# Patient Record
Sex: Female | Born: 1945 | Race: White | Hispanic: No | Marital: Married | Smoking: Never smoker
Health system: Southern US, Community
[De-identification: ages and names within clinical notes are randomized; demographics above are authoritative.]

## PROBLEM LIST (undated history)

## (undated) DIAGNOSIS — R600 Localized edema: Secondary | ICD-10-CM

## (undated) DIAGNOSIS — E079 Disorder of thyroid, unspecified: Secondary | ICD-10-CM

## (undated) DIAGNOSIS — I10 Essential (primary) hypertension: Secondary | ICD-10-CM

## (undated) DIAGNOSIS — G4733 Obstructive sleep apnea (adult) (pediatric): Secondary | ICD-10-CM

## (undated) DIAGNOSIS — Z9989 Dependence on other enabling machines and devices: Secondary | ICD-10-CM

## (undated) HISTORY — DX: Dependence on other enabling machines and devices: Z99.89

## (undated) HISTORY — PX: TUBAL LIGATION: SHX77

## (undated) HISTORY — DX: Essential (primary) hypertension: I10

## (undated) HISTORY — DX: Disorder of thyroid, unspecified: E07.9

## (undated) HISTORY — DX: Localized edema: R60.0

## (undated) HISTORY — PX: OTHER SURGICAL HISTORY: SHX169

## (undated) HISTORY — DX: Obstructive sleep apnea (adult) (pediatric): G47.33

---

## 2014-09-06 NOTE — Progress Notes (Addendum)
Cardiology Office Note   Date:  09/07/2014   ID:  Karen, Schultz 03-12-1945, MRN [id number]  PCP:  Neale Burly, MD  Cardiologist:   Sharol Harness, MD   Chief Complaint  Patient presents with  . New Evaluation    PCP referral - aching pain on right side of chest when going up hill when walking daily mile or walking stairs; complains of shortness of breath with exertion (not all the time); reports she was told years ago she had a skipped beats but she doesn't always feel this; occasional lightheadedness/dizziness; complains of bilateral LE edema for years      History of Present Illness: Karen Schultz is a 69 y.o. female OSA and HTN who presents for an evaluation of LE edema.  She first noticed intermittent swelling approximately 3 years ago.  However, since June it has increased.  She especially noted that her legs were swollen while on a cruise ship in June.  She especially notices it after she has been on her feet for extended periods of time or when it is hot outside.  She denies orthopnea, PND or recent weight change.  She is able to walk up four flights of stairs before feeling short of breath.  She has been trying to limit her salt intake.  [name] sometimes notes an aching sensation on the R side of her chest when walking up an incline.   It is associated with SOB and the sensation does not radiate.  This is not associated with nausea or diaphoresis.  It improves with rest.  She typically notices it when it is very hot and humid outside.  She mentioned this to one of her doctors in March 2016 and was started on a qvar inhaler without improvement.  She walks a mile daily and does yoga three times per week.  No orthopnea or PND  Of note, [name] has undergone 2 stress tests, most recently 8-9 years ago and each was unremarkable.      Past Medical History  Diagnosis Date  . OSA on CPAP     5-6 years (as of 09/2014)    No past surgical history on  file.   Current Outpatient Prescriptions  Medication Sig Dispense Refill  . beclomethasone (QVAR) 80 MCG/ACT inhaler Inhale into the lungs as needed.    . benazepril-hydrochlorthiazide (LOTENSIN HCT) 20-25 MG per tablet Take 1 tablet by mouth daily.    . Calcium Carbonate-Vit D-Min (CALTRATE 600+D PLUS MINERALS) 600-800 MG-UNIT CHEW Chew 2 Doses by mouth daily.    . fluticasone (FLONASE) 50 MCG/ACT nasal spray Place into both nostrils as needed for allergies or rhinitis.    Marland Kitchen levothyroxine (SYNTHROID, LEVOTHROID) 100 MCG tablet Take 100 mcg by mouth daily before breakfast.    . NON FORMULARY at bedtime. CPAP    . RA KRILL OIL PO Take 1 capsule by mouth daily.    . vitamin B-12 (CYANOCOBALAMIN) 1000 MCG tablet Take 1,000 mcg by mouth daily.     No current facility-administered medications for this visit.    Allergies:   Codeine and Sulfur    Social History:  The patient  reports that she has never smoked. She has never used smokeless tobacco. She reports that she drinks alcohol. She reports that she does not use illicit drugs.   Family History:  The patient's family history is not on file.    ROS:  Please see the history of present illness.   Otherwise,  review of systems are positive for none.   All other systems are reviewed and negative.    PHYSICAL EXAM: VS:  BP 142/86 mmHg  Pulse 84  Ht 5\' 2"  (1.575 m)  Wt 87.363 kg (192 lb 9.6 oz)  BMI 35.22 kg/m2 , BMI Body mass index is 35.22 kg/(m^2). GENERAL:  Well appearing HEENT:  Pupils equal round and reactive, fundi not visualized, oral mucosa unremarkable NECK:  No jugular venous distention, waveform within normal limits, carotid upstroke brisk and symmetric, no bruits, no thyromegaly LYMPHATICS:  No cervical, adenopathy LUNGS:  Clear to auscultation bilaterally CHEST:  Unremarkable HEART:  PMI not displaced or sustained,S1 and S2 within normal limits, no S3, no S4, no clicks, no rubs, no murmurs ABD:  Flat, positive bowel  sounds normal in frequency in pitch, no bruits, no rebound, no guarding, no midline pulsatile mass, no hepatomegaly, no splenomegaly EXT:  2 plus pulses throughout, +non-pitting edema to above the ankles bilaterally, no cyanosis no clubbing SKIN:  No rashes no nodules NEURO:  Cranial nerves II through XII grossly intact, motor grossly intact throughout PSYCH:  Cognitively intact, oriented to person place and time    EKG:  EKG is not ordered today. 08/10/14: Sinus rhythm.  Borderline R axis deviation.  Recent Labs: No results found for requested labs within last 365 days.    Lipid Panel No results found for: CHOL, TRIG, HDL, CHOLHDL, VLDL, LDLCALC, LDLDIRECT    Wt Readings from Last 3 Encounters:  09/07/14 87.363 kg (192 lb 9.6 oz)      Other studies Reviewed: Additional studies/ records that were reviewed today include: EKG Review of the above records demonstrates:  Please see elsewhere in the note.   Lipid panel 08/10/14: chol 153, tri 158, hdl 46, ldl 75  Creatinine 0.85 Carotid arteries: L PSV <110cm/s, R PSV <110cm/s R ABI 1.04, L ABI 1.03 ASSESSMENT AND PLAN:  # LE edema: Exam and history are not consistent with heart failure.  JVD is not distended and for the most part, her edema is not pitting.  This is more likely related to venous insufficiency. - Check BNP  # Chest pain: Patient has R sided, atypical chest pain with ambulating up an incline.  She is otherwise able to exercise without limitation.  Given her age and HTN, will arrange for stress testing. - exercise treadmill test   Current medicines are reviewed at length with the patient today.  The patient does not have concerns regarding medicines.  The following changes have been made:  no change  Labs/ tests ordered today include: ETT  Orders Placed This Encounter  Procedures  . B Nat Peptide  . Exercise Tolerance Test     Disposition:   FU with Oval Linsey in 1 year   Signed, Sharol Harness, MD   09/07/2014 12:35 PM    [facility]

## 2014-09-07 ENCOUNTER — Encounter: Payer: Self-pay | Admitting: Cardiovascular Disease

## 2014-09-07 ENCOUNTER — Ambulatory Visit (INDEPENDENT_AMBULATORY_CARE_PROVIDER_SITE_OTHER): Payer: Medicare Other | Admitting: Cardiovascular Disease

## 2014-09-07 VITALS — BP 142/86 | HR 84 | Ht 62.0 in | Wt 192.6 lb

## 2014-09-07 DIAGNOSIS — R079 Chest pain, unspecified: Secondary | ICD-10-CM | POA: Diagnosis not present

## 2014-09-07 DIAGNOSIS — Z9989 Dependence on other enabling machines and devices: Secondary | ICD-10-CM

## 2014-09-07 DIAGNOSIS — I10 Essential (primary) hypertension: Secondary | ICD-10-CM | POA: Insufficient documentation

## 2014-09-07 DIAGNOSIS — R600 Localized edema: Secondary | ICD-10-CM

## 2014-09-07 DIAGNOSIS — G4733 Obstructive sleep apnea (adult) (pediatric): Secondary | ICD-10-CM | POA: Diagnosis not present

## 2014-09-07 DIAGNOSIS — E669 Obesity, unspecified: Secondary | ICD-10-CM

## 2014-09-07 NOTE — Patient Instructions (Signed)
Your physician recommends that you return for lab work TODAY >> there is a lab on the first floor of this building in [address] (they are closed for lunch from 1230-130) >> there is a lab on [address] - [address]  Exercise Stress Electrocardiogram An exercise stress electrocardiogram is a test to check how blood flows to your heart. It is done to find areas of poor blood flow. You will need to walk on a treadmill for this test. The electrocardiogram will record your heartbeat when you are at rest and when you are exercising. BEFORE THE PROCEDURE  Do not have drinks with caffeine or foods with caffeine for 24 hours before the test, or as told by your doctor. This includes coffee, tea (even decaf tea), sodas, chocolate, and cocoa.  Follow your doctor's instructions about eating and drinking before the test.  Ask your doctor what medicines you should or should not take before the test. Take your medicines with water unless told by your doctor not to.  If you use an inhaler, bring it with you to the test.  Bring a snack to eat after the test.  Do not  smoke for 4 hours before the test.  Do not put lotions, powders, creams, or oils on your chest before the test.  Wear comfortable shoes and clothing. PROCEDURE  You will have patches put on your chest. Small areas of your chest may need to be shaved. Wires will be connected to the patches.  Your heart rate will be watched while you are resting and while you are exercising.  You will walk on the treadmill. The treadmill will slowly get faster to raise your heart rate.  The test will take about 1-2 hours. AFTER THE PROCEDURE  Your heart rate and blood pressure will be watched after the test.  You may return to your normal diet, activities, and medicines or as told by your doctor. Document Released: 07/11/2007 Document Revised: 06/08/2013 Document Reviewed: 09/29/2012 [facility] Patient Information 2015 [place], [place]. This  information is not intended to replace advice given to you by your health care provider. Make sure you discuss any questions you have with your health care provider.   Your physician wants you to follow-up in: 1 year with Dr. Oval Linsey. You will receive a reminder letter in the mail two months in advance. If you don't receive a letter, please call our office to schedule the follow-up appointment.

## 2014-09-08 LAB — BRAIN NATRIURETIC PEPTIDE: Brain Natriuretic Peptide: 35.6 pg/mL (ref 0.0–100.0)

## 2014-09-13 ENCOUNTER — Telehealth: Payer: Self-pay | Admitting: *Deleted

## 2014-09-13 NOTE — Telephone Encounter (Signed)
LEFT MESSAGE TO CALL BACK ABOUT LAB RESULTS

## 2014-09-13 NOTE — Telephone Encounter (Signed)
-----   Message from Skeet Latch, MD sent at 09/08/2014 12:45 PM EDT ----- Please let Ms. Hassell Done know that her LE edema and SOB are not from heart failure.  Her BNP is normal.  It is likely from venous insufficiency.  She should try elevating her legs and wearing support hose.   You can get compression stockings in [place] at [employer] # [phone].

## 2014-09-14 ENCOUNTER — Encounter: Payer: Self-pay | Admitting: Cardiovascular Disease

## 2014-09-14 NOTE — Telephone Encounter (Signed)
Returning your call from yesterday. °

## 2014-09-14 NOTE — Telephone Encounter (Signed)
Spoke to patinet

## 2014-09-14 NOTE — Telephone Encounter (Signed)
Spoke to patient's husband. Result given.  verbalized understanding.  husband states patient has support stocking.

## 2014-09-22 ENCOUNTER — Telehealth (HOSPITAL_COMMUNITY): Payer: Self-pay

## 2014-09-22 NOTE — Telephone Encounter (Signed)
Encounter complete. 

## 2014-09-24 ENCOUNTER — Ambulatory Visit (HOSPITAL_COMMUNITY)
Admission: RE | Admit: 2014-09-24 | Discharge: 2014-09-24 | Disposition: A | Discharge status: Home / Self Care | Payer: Medicare Other | Source: Ambulatory Visit | Attending: Cardiology | Admitting: Cardiology

## 2014-09-24 DIAGNOSIS — R079 Chest pain, unspecified: Secondary | ICD-10-CM | POA: Insufficient documentation

## 2014-09-24 LAB — EXERCISE TOLERANCE TEST
CHL CUP MPHR: 151 {beats}/min
CHL CUP RESTING HR STRESS: 79 {beats}/min
CHL RATE OF PERCEIVED EXERTION: 16
CSEPED: 6 min
Estimated workload: 7 METS
Peak HR: 151 {beats}/min
Percent HR: 100 %

## 2015-03-21 DIAGNOSIS — Z1231 Encounter for screening mammogram for malignant neoplasm of breast: Secondary | ICD-10-CM | POA: Diagnosis not present

## 2015-03-21 DIAGNOSIS — Z6841 Body Mass Index (BMI) 40.0 and over, adult: Secondary | ICD-10-CM | POA: Diagnosis not present

## 2015-03-21 DIAGNOSIS — Z01419 Encounter for gynecological examination (general) (routine) without abnormal findings: Secondary | ICD-10-CM | POA: Diagnosis not present

## 2015-04-10 DIAGNOSIS — L239 Allergic contact dermatitis, unspecified cause: Secondary | ICD-10-CM | POA: Diagnosis not present

## 2015-04-10 DIAGNOSIS — Z79899 Other long term (current) drug therapy: Secondary | ICD-10-CM | POA: Diagnosis not present

## 2015-04-10 DIAGNOSIS — E039 Hypothyroidism, unspecified: Secondary | ICD-10-CM | POA: Diagnosis not present

## 2015-04-14 DIAGNOSIS — E784 Other hyperlipidemia: Secondary | ICD-10-CM | POA: Diagnosis not present

## 2015-04-14 DIAGNOSIS — L2389 Allergic contact dermatitis due to other agents: Secondary | ICD-10-CM | POA: Diagnosis not present

## 2015-04-14 DIAGNOSIS — Z6834 Body mass index (BMI) 34.0-34.9, adult: Secondary | ICD-10-CM | POA: Diagnosis not present

## 2015-04-14 DIAGNOSIS — E02 Subclinical iodine-deficiency hypothyroidism: Secondary | ICD-10-CM | POA: Diagnosis not present

## 2015-04-14 DIAGNOSIS — G932 Benign intracranial hypertension: Secondary | ICD-10-CM | POA: Diagnosis not present

## 2015-06-10 DIAGNOSIS — Z6834 Body mass index (BMI) 34.0-34.9, adult: Secondary | ICD-10-CM | POA: Diagnosis not present

## 2015-06-10 DIAGNOSIS — R1084 Generalized abdominal pain: Secondary | ICD-10-CM | POA: Diagnosis not present

## 2015-06-16 DIAGNOSIS — R1084 Generalized abdominal pain: Secondary | ICD-10-CM | POA: Diagnosis not present

## 2015-06-16 DIAGNOSIS — R109 Unspecified abdominal pain: Secondary | ICD-10-CM | POA: Diagnosis not present

## 2015-06-21 DIAGNOSIS — M549 Dorsalgia, unspecified: Secondary | ICD-10-CM | POA: Diagnosis not present

## 2015-06-21 DIAGNOSIS — M47816 Spondylosis without myelopathy or radiculopathy, lumbar region: Secondary | ICD-10-CM | POA: Diagnosis not present

## 2015-06-21 DIAGNOSIS — M47814 Spondylosis without myelopathy or radiculopathy, thoracic region: Secondary | ICD-10-CM | POA: Diagnosis not present

## 2015-07-01 DIAGNOSIS — Z6834 Body mass index (BMI) 34.0-34.9, adult: Secondary | ICD-10-CM | POA: Diagnosis not present

## 2015-07-01 DIAGNOSIS — M545 Low back pain: Secondary | ICD-10-CM | POA: Diagnosis not present

## 2015-07-21 DIAGNOSIS — Z79899 Other long term (current) drug therapy: Secondary | ICD-10-CM | POA: Diagnosis not present

## 2015-07-21 DIAGNOSIS — M545 Low back pain: Secondary | ICD-10-CM | POA: Diagnosis not present

## 2015-07-21 DIAGNOSIS — I10 Essential (primary) hypertension: Secondary | ICD-10-CM | POA: Diagnosis not present

## 2015-07-21 DIAGNOSIS — Z6834 Body mass index (BMI) 34.0-34.9, adult: Secondary | ICD-10-CM | POA: Diagnosis not present

## 2015-07-21 DIAGNOSIS — E038 Other specified hypothyroidism: Secondary | ICD-10-CM | POA: Diagnosis not present

## 2015-07-21 DIAGNOSIS — R5383 Other fatigue: Secondary | ICD-10-CM | POA: Diagnosis not present

## 2015-09-25 NOTE — Progress Notes (Signed)
Cardiology Office Note   Date:  09/26/2015   ID:  Karen Schultz, Karen Schultz 21-Jan-1946, MRN [id number]  PCP:  Neale Burly, MD  Cardiologist:   Skeet Latch, MD   Chief Complaint  Patient presents with  . Annual Exam    sob; only when not consistently exercising. edema;in ankles and and feet. numbness in legs.      History of Present Illness: Karen Schultz is a 70 y.o. female OSA and HTN who presents for follow up.  [name] was first seen 09/2014 with lower extremity edema and atypical chest pain.  Her edema has been occurring intermittently for 3 years and was felt to be due to venous insufficiency.  BNP was 35.  She also reported aching in the right side of her chest when walking up an incline and was referred for ETT 09/24/14 that was negative for ischemia.  She achieved 7 METS on the Bruce protocol.  She notes that she has been gaining weight.  She goes to yoga once per week and walks a mile daily.    She also goes to the [facility] once or twice per week.  She denies chest pain with exertion but does note shortness of breath when walking up an incline.  She has not been following any particular diet.  She is concerned because she gained 8 lb in the last year.  She wonders if this could be due to her thyroid.    [name] reports burning pain in her left calf with walking.  She also notes lower extremity edema that is worse after walking or going on long car rides.  She denies orthopnea or PND.   Past Medical History:  Diagnosis Date  . Lower extremity edema 09/26/2015  . OSA on CPAP    5-6 years (as of 09/2014)    No past surgical history on file.   Current Outpatient Prescriptions  Medication Sig Dispense Refill  . benazepril-hydrochlorthiazide (LOTENSIN HCT) 20-25 MG per tablet Take 1 tablet by mouth daily.    . Calcium Carbonate-Vit D-Min (CALTRATE 600+D PLUS MINERALS) 600-800 MG-UNIT CHEW Chew 2 Doses by mouth daily.    . fluticasone (FLONASE) 50 MCG/ACT nasal spray  Place into both nostrils as needed for allergies or rhinitis.    Marland Kitchen levothyroxine (SYNTHROID, LEVOTHROID) 100 MCG tablet Take 100 mcg by mouth daily before breakfast.    . NON FORMULARY at bedtime. CPAP    . RA KRILL OIL PO Take 1 capsule by mouth daily.    . vitamin B-12 (CYANOCOBALAMIN) 1000 MCG tablet Take 1,000 mcg by mouth daily.     No current facility-administered medications for this visit.     Allergies:   Codeine and Sulfur    Social History:  The patient  reports that she has never smoked. She has never used smokeless tobacco. She reports that she drinks alcohol. She reports that she does not use drugs.   Family History:  The patient's family history is not on file.    ROS:  Please see the history of present illness.   Otherwise, review of systems are positive for none.   All other systems are reviewed and negative.    PHYSICAL EXAM: VS:  BP 125/77   Pulse 74   Ht 5' (1.524 m)   Wt 203 lb 6.4 oz (92.3 kg)   BMI 39.72 kg/m  , BMI Body mass index is 39.72 kg/m. GENERAL:  Well appearing HEENT:  Pupils equal round and reactive, fundi  not visualized, oral mucosa unremarkable NECK:  No jugular venous distention, waveform within normal limits, carotid upstroke brisk and symmetric, no bruits, no thyromegaly LYMPHATICS:  No cervical, adenopathy LUNGS:  Clear to auscultation bilaterally CHEST:  Unremarkable HEART:  PMI not displaced or sustained,S1 and S2 within normal limits, no S3, no S4, no clicks, no rubs, no murmurs ABD:  Flat, positive bowel sounds normal in frequency in pitch, no bruits, no rebound, no guarding, no midline pulsatile mass, no hepatomegaly, no splenomegaly EXT:  2 plus pulses throughout, +non-pitting edema to above the ankles bilaterally, no cyanosis no clubbing SKIN:  No rashes no nodules NEURO:  Cranial nerves II through XII grossly intact, motor grossly intact throughout PSYCH:  Cognitively intact, oriented to person place and time    EKG:  EKG is  ordered today. 09/26/15: Sinus rhythm rate 73 bpm.  Borderline R axis deviation 08/10/14: Sinus rhythm.  Borderline R axis deviation.  Recent Labs: No results found for requested labs within last 8760 hours.    Lipid Panel No results found for: CHOL, TRIG, HDL, CHOLHDL, VLDL, LDLCALC, LDLDIRECT    Wt Readings from Last 3 Encounters:  09/26/15 203 lb 6.4 oz (92.3 kg)  09/07/14 192 lb 9.6 oz (87.4 kg)      Other studies Reviewed: Additional studies/ records that were reviewed today include: EKG Review of the above records demonstrates:  Please see elsewhere in the note.   Lipid panel 08/10/14: chol 153, tri 158, hdl 46, ldl 75  Creatinine 0.85 Carotid arteries: L PSV <110cm/s, R PSV <110cm/s R ABI 1.04, L ABI 1.03  ASSESSMENT AND PLAN:  # LE edema:  # Exertional dyspnea:  Exam and history are not consistent with heart failure.  JVD is not distended and for the most part, her edema is not pitting.  This is more likely related to venous insufficiency.  BNP was normal at her last visit.  I have recommended that she wear compression stockings.  We will also check an echocardiogram given that she also reports exertional dyspnea. - Check BNP  # Hypothyroidism: [name] prefers to see an endocrinologist for her hypothyroidism.  We will refer her today.  # Claudication: [name] reports claudication when walking.  She has normal DP/PT pulses bilaterally.  We will check ABIs and arterial Dopplers.  Current medicines are reviewed at length with the patient today.  The patient does not have concerns regarding medicines.  The following changes have been made:  no change  Labs/ tests ordered today include:   Orders Placed This Encounter  Procedures  . EKG 12-Lead  . ECHOCARDIOGRAM COMPLETE     Disposition:   FU with Oval Linsey in 1 year   Signed, Skeet Latch, MD  09/26/2015 11:01 AM    [place]

## 2015-09-26 ENCOUNTER — Ambulatory Visit (INDEPENDENT_AMBULATORY_CARE_PROVIDER_SITE_OTHER): Payer: Commercial Managed Care - HMO | Admitting: Cardiovascular Disease

## 2015-09-26 ENCOUNTER — Encounter (INDEPENDENT_AMBULATORY_CARE_PROVIDER_SITE_OTHER): Payer: Self-pay

## 2015-09-26 ENCOUNTER — Encounter: Payer: Self-pay | Admitting: Cardiovascular Disease

## 2015-09-26 VITALS — BP 125/77 | HR 74 | Ht 60.0 in | Wt 203.4 lb

## 2015-09-26 DIAGNOSIS — R0602 Shortness of breath: Secondary | ICD-10-CM

## 2015-09-26 DIAGNOSIS — E039 Hypothyroidism, unspecified: Secondary | ICD-10-CM | POA: Diagnosis not present

## 2015-09-26 DIAGNOSIS — R600 Localized edema: Secondary | ICD-10-CM | POA: Diagnosis not present

## 2015-09-26 DIAGNOSIS — I10 Essential (primary) hypertension: Secondary | ICD-10-CM

## 2015-09-26 HISTORY — DX: Localized edema: R60.0

## 2015-09-26 NOTE — Patient Instructions (Addendum)
Medication Instructions:  Your physician recommends that you continue on your current medications as directed. Please refer to the Current Medication list given to you today.  Labwork: none  Testing/Procedures: Your physician has requested that you have an echocardiogram. Echocardiography is a painless test that uses sound waves to create images of your heart. It provides your doctor with information about the size and shape of your heart and how well your heart's chambers and valves are working. This procedure takes approximately one hour. There are no restrictions for this procedure. AT [place]  Your physician has requested that you have a lower or upper extremity arterial duplex. This test is an ultrasound of the arteries in the legs or arms. It looks at arterial blood flow in the legs and arms. Allow one hour for Lower and Upper Arterial scans. There are no restrictions or special instructions WITH ABI's  Follow-Up: Your physician wants you to follow-up in: 1 year  You will receive a reminder letter in the mail two months in advance. If you don't receive a letter, please call our office to schedule the follow-up appointment.  Will arrange for you to see an Endocrinologist and call with date and time  If you need a refill on your cardiac medications before your next appointment, please call your pharmacy.

## 2015-09-26 NOTE — Addendum Note (Signed)
Addended by: Alvina Filbert B on: 09/26/2015 01:55 PM   Modules accepted: Orders

## 2015-09-27 ENCOUNTER — Other Ambulatory Visit: Payer: Self-pay | Admitting: Cardiovascular Disease

## 2015-09-27 DIAGNOSIS — R600 Localized edema: Secondary | ICD-10-CM

## 2015-10-05 ENCOUNTER — Other Ambulatory Visit (HOSPITAL_COMMUNITY): Payer: Commercial Managed Care - HMO

## 2015-10-06 ENCOUNTER — Ambulatory Visit (HOSPITAL_COMMUNITY)
Admission: RE | Admit: 2015-10-06 | Discharge: 2015-10-06 | Disposition: A | Discharge status: Home / Self Care | Payer: Commercial Managed Care - HMO | Source: Ambulatory Visit | Attending: Cardiology | Admitting: Cardiology

## 2015-10-06 DIAGNOSIS — I10 Essential (primary) hypertension: Secondary | ICD-10-CM | POA: Insufficient documentation

## 2015-10-06 DIAGNOSIS — I739 Peripheral vascular disease, unspecified: Secondary | ICD-10-CM | POA: Diagnosis not present

## 2015-10-06 DIAGNOSIS — R600 Localized edema: Secondary | ICD-10-CM | POA: Diagnosis not present

## 2015-10-07 ENCOUNTER — Ambulatory Visit (HOSPITAL_COMMUNITY): Payer: Commercial Managed Care - HMO | Attending: Internal Medicine

## 2015-10-07 ENCOUNTER — Other Ambulatory Visit: Payer: Self-pay

## 2015-10-07 DIAGNOSIS — R0602 Shortness of breath: Secondary | ICD-10-CM

## 2015-10-07 DIAGNOSIS — Z6839 Body mass index (BMI) 39.0-39.9, adult: Secondary | ICD-10-CM | POA: Insufficient documentation

## 2015-10-07 DIAGNOSIS — I119 Hypertensive heart disease without heart failure: Secondary | ICD-10-CM | POA: Insufficient documentation

## 2015-10-07 DIAGNOSIS — I071 Rheumatic tricuspid insufficiency: Secondary | ICD-10-CM | POA: Diagnosis not present

## 2015-10-07 DIAGNOSIS — I358 Other nonrheumatic aortic valve disorders: Secondary | ICD-10-CM | POA: Insufficient documentation

## 2015-10-07 DIAGNOSIS — R600 Localized edema: Secondary | ICD-10-CM | POA: Diagnosis not present

## 2015-10-07 DIAGNOSIS — G4733 Obstructive sleep apnea (adult) (pediatric): Secondary | ICD-10-CM | POA: Insufficient documentation

## 2015-10-07 DIAGNOSIS — R0600 Dyspnea, unspecified: Secondary | ICD-10-CM | POA: Diagnosis present

## 2015-10-07 DIAGNOSIS — I340 Nonrheumatic mitral (valve) insufficiency: Secondary | ICD-10-CM | POA: Insufficient documentation

## 2015-10-07 DIAGNOSIS — E669 Obesity, unspecified: Secondary | ICD-10-CM | POA: Diagnosis not present

## 2015-10-07 DIAGNOSIS — I351 Nonrheumatic aortic (valve) insufficiency: Secondary | ICD-10-CM | POA: Diagnosis not present

## 2015-10-11 ENCOUNTER — Telehealth: Payer: Self-pay | Admitting: Cardiovascular Disease

## 2015-10-11 NOTE — Telephone Encounter (Signed)
Note not needed 

## 2015-10-17 ENCOUNTER — Telehealth: Payer: Self-pay | Admitting: Cardiovascular Disease

## 2015-10-17 NOTE — Telephone Encounter (Signed)
Spoke w/ patient and discussed results of recent tests (echo and ABI) in detail. She voiced understanding and thanks. Requests copies of these be sent to Dr. Sherrie Sport.

## 2015-10-17 NOTE — Telephone Encounter (Signed)
New Message  Pt call requesting to speak with Rn about results of test on 8/31 and 9/1. Please call back to discuss

## 2015-11-10 DIAGNOSIS — Z6835 Body mass index (BMI) 35.0-35.9, adult: Secondary | ICD-10-CM | POA: Diagnosis not present

## 2015-11-10 DIAGNOSIS — M545 Low back pain: Secondary | ICD-10-CM | POA: Diagnosis not present

## 2015-11-10 DIAGNOSIS — H60392 Other infective otitis externa, left ear: Secondary | ICD-10-CM | POA: Diagnosis not present

## 2015-11-10 DIAGNOSIS — I10 Essential (primary) hypertension: Secondary | ICD-10-CM | POA: Diagnosis not present

## 2015-11-10 DIAGNOSIS — E038 Other specified hypothyroidism: Secondary | ICD-10-CM | POA: Diagnosis not present

## 2015-11-17 ENCOUNTER — Encounter: Payer: Self-pay | Admitting: Internal Medicine

## 2015-11-17 ENCOUNTER — Ambulatory Visit (INDEPENDENT_AMBULATORY_CARE_PROVIDER_SITE_OTHER): Payer: Commercial Managed Care - HMO | Admitting: Internal Medicine

## 2015-11-17 VITALS — BP 124/80 | HR 74 | Ht 60.5 in | Wt 199.0 lb

## 2015-11-17 DIAGNOSIS — E039 Hypothyroidism, unspecified: Secondary | ICD-10-CM | POA: Diagnosis not present

## 2015-11-17 LAB — TSH: TSH: 6.55 u[IU]/mL — ABNORMAL HIGH (ref 0.35–4.50)

## 2015-11-17 LAB — T4, FREE: FREE T4: 0.96 ng/dL (ref 0.60–1.60)

## 2015-11-17 LAB — T3, FREE: T3, Free: 2.9 pg/mL (ref 2.3–4.2)

## 2015-11-17 MED ORDER — LEVOTHYROXINE SODIUM 112 MCG PO TABS: 112.0000 ug | ORAL_TABLET | Freq: Every day | ORAL | 1 refills | Status: DC

## 2015-11-17 NOTE — Patient Instructions (Signed)
Please stop at the lab.  Please continue Levothyroxine 100 mcg daily.  Take the thyroid hormone every day, with water, at least 30 minutes before breakfast, separated by at least 4 hours from: - acid reflux medications - calcium - iron - multivitamins  Please come back for a follow-up appointment in 6 months. 

## 2015-11-17 NOTE — Progress Notes (Signed)
Patient ID: Karen Schultz, female   DOB: January 16, 1946, 70 y.o.   MRN: [id number]    HPI  Karen Schultz is a 70 y.o.-year-old female, referred by her cardiologist, Dr. Skeet Latch, for management of hypothyroidism.  Pt. has been dx with hypothyroidism in 1990s in [place] >> on Synthroid initially for a long time >> Levothyroxine 100 mcg (last decrease was from 150 to 100 mcg 6 mo).  She takes the thyroid hormone: - + fasting - with water - separated by ~30 min or less from b'fast  - no calcium after b'fast  (stopped months) - no iron, PPIs, multivitamins   No thyroid tests available for review.   Pt describes: - + weight gain - + fatigue - No heat or cold intolerance - No anxiety or depression - + Occasional constipation - no dry skin - + hair loss  Pt denies feeling nodules in neck, hoarseness, dysphagia/odynophagia, SOB with lying down.  She has no FH of thyroid disorders. No FH of thyroid cancer.  No h/o radiation tx to head or neck. No recent use of iodine supplements. Not on Biotin.  Pt. also has a history of B12 def, HTN, obstructive sleep apnea.  Her right hand is in a brace today << tendinitis from crocheting too much.  She exercises 3-4 times a week: Hand weights, ball, walking, yoga.  ROS: Constitutional: + see history of present illness Eyes: no blurry vision, no xerophthalmia ENT: no sore throat, + see history of present illness, + occasional tinnitus  Cardiovascular: no CP/ + SOB/no palpitations/+ leg swelling Respiratory: no cough/ + SOB Gastrointestinal: no N/V/D/ + occasional C/+ heartburn  Musculoskeletal: no muscle/joint aches Skin: no rashes Neurological: no tremors/numbness/tingling/dizziness Psychiatric: no depression/anxiety  Past Medical History:  Diagnosis Date  . Lower extremity edema 09/26/2015  . OSA on CPAP    5-6 years (as of 09/2014)   Past surgical history: - C-section in 1977 and 1979 - Tubal ligation  Social History   Social  History  . Marital status: Married    Spouse name: N/A  . Number of children: 1   Occupational History  . Homemaker    Social History Main Topics  . Smoking status: Never Smoker  . Smokeless tobacco: Never Used  . Alcohol use 0.0 oz/week     Comment: socially  . Drug use: No   Current Outpatient Prescriptions on File Prior to Visit  Medication Sig Dispense Refill  . benazepril-hydrochlorthiazide (LOTENSIN HCT) 20-25 MG per tablet Take 1 tablet by mouth daily.    . Calcium Carbonate-Vit D-Min (CALTRATE 600+D PLUS MINERALS) 600-800 MG-UNIT CHEW Chew 2 Doses by mouth daily.    . fluticasone (FLONASE) 50 MCG/ACT nasal spray Place into both nostrils as needed for allergies or rhinitis.    Marland Kitchen levothyroxine (SYNTHROID, LEVOTHROID) 100 MCG tablet Take 100 mcg by mouth daily before breakfast.    . NON FORMULARY at bedtime. CPAP    . RA KRILL OIL PO Take 1 capsule by mouth daily.    . vitamin B-12 (CYANOCOBALAMIN) 1000 MCG tablet Take 1,000 mcg by mouth daily.     No current facility-administered medications on file prior to visit.    Allergies  Allergen Reactions  . Codeine Nausea And Vomiting  . Sulfur     headache   No family history on file.  PE: BP 124/80 (BP Location: Left Arm, Patient Position: Sitting)   Pulse 74   Ht 5' 0.5" (1.537 m)   Wt 199 lb (90.3 kg)  SpO2 97%   BMI 38.22 kg/m  Wt Readings from Last 3 Encounters:  11/17/15 199 lb (90.3 kg)  09/26/15 203 lb 6.4 oz (92.3 kg)  09/07/14 192 lb 9.6 oz (87.4 kg)   Constitutional: overweight, in NAD Eyes: PERRLA, EOMI, no exophthalmos ENT: moist mucous membranes, no thyromegaly, no cervical lymphadenopathy Cardiovascular: RRR, No MRG Respiratory: CTA B Gastrointestinal: abdomen soft, NT, ND, BS+ Musculoskeletal: no deformities, strength intact in all 4: R hand in brace Skin: moist, warm, no rashes Neurological: no tremor with outstretched hands, DTR normal in all 4  ASSESSMENT: 1. Hypothyroidism  PLAN:  1.  Patient with long-standing hypothyroidism, on levothyroxine therapy. This is reportedly uncontrolled, however, I do not have TFTs for review. Her last levothyroxine dose change was several months ago >> we will recheck TFTs today. We will change the dose of levothyroxine depending on her labs.  - she appears euthyroid - she does not appear to have a goiter, thyroid nodules, or neck compression symptoms - We discussed about correct intake of levothyroxine, fasting, with water, separated by at least 30 minutes from breakfast, and separated by more than 4 hours from calcium, iron, multivitamins, acid reflux medications (PPIs).She is occasionally taking levothyroxine later, if she forgets to take it in the morning. I strongly advised her to start drinking her smoothie at least 30 minutes after she takes the levothyroxine and, if she forgets the tablets, to take it later in the day, but fasting.  - Will continue the current dose of levothyroxine for now, 100 g daily. - If labs today are abnormal, she will need to return in 6 weeks for repeat labs - Otherwise, I will see her back in 6 months  Component     Latest Ref Rng & Units 11/17/2015  T4,Free(Direct)     0.60 - 1.60 ng/dL 0.96  TSH     0.35 - 4.50 uIU/mL 6.55 (H)  Triiodothyronine,Free,Serum     2.3 - 4.2 pg/mL 2.9   TSH is high, therefore, will increase the levothyroxine dose to 112 g daily and repeat her tests in 6 weeks.  Philemon Kingdom, MD PhD [facility] Endocrinology

## 2015-12-26 ENCOUNTER — Other Ambulatory Visit (INDEPENDENT_AMBULATORY_CARE_PROVIDER_SITE_OTHER): Payer: Commercial Managed Care - HMO

## 2015-12-26 DIAGNOSIS — E039 Hypothyroidism, unspecified: Secondary | ICD-10-CM | POA: Diagnosis not present

## 2015-12-26 DIAGNOSIS — I10 Essential (primary) hypertension: Secondary | ICD-10-CM | POA: Diagnosis not present

## 2015-12-26 DIAGNOSIS — M545 Low back pain: Secondary | ICD-10-CM | POA: Diagnosis not present

## 2015-12-26 LAB — T4, FREE: Free T4: 1.41 ng/dL (ref 0.60–1.60)

## 2015-12-26 LAB — TSH: TSH: 2.26 u[IU]/mL (ref 0.35–4.50)

## 2016-01-02 ENCOUNTER — Telehealth: Payer: Self-pay | Admitting: Internal Medicine

## 2016-01-02 ENCOUNTER — Other Ambulatory Visit: Payer: Self-pay

## 2016-01-02 MED ORDER — LEVOTHYROXINE SODIUM 112 MCG PO TABS: 112.0000 ug | ORAL_TABLET | Freq: Every day | ORAL | 1 refills | Status: DC

## 2016-01-02 NOTE — Telephone Encounter (Signed)
Sent!

## 2016-01-02 NOTE — Telephone Encounter (Signed)
Patient need refill of levothyroxine Karen Schultz, LEVOTHROID) [place], [place] [phone] (Phone) [phone] (Fax)

## 2016-01-09 DIAGNOSIS — Z6835 Body mass index (BMI) 35.0-35.9, adult: Secondary | ICD-10-CM | POA: Diagnosis not present

## 2016-01-09 DIAGNOSIS — M25511 Pain in right shoulder: Secondary | ICD-10-CM | POA: Diagnosis not present

## 2016-01-09 DIAGNOSIS — M19011 Primary osteoarthritis, right shoulder: Secondary | ICD-10-CM | POA: Diagnosis not present

## 2016-01-11 ENCOUNTER — Other Ambulatory Visit: Payer: Self-pay | Admitting: *Deleted

## 2016-01-11 MED ORDER — LEVOTHYROXINE SODIUM 112 MCG PO TABS: 112.0000 ug | ORAL_TABLET | Freq: Every day | ORAL | 1 refills | Status: DC

## 2016-01-11 NOTE — Telephone Encounter (Signed)
Pt called and said that we need to resubmit the script for Levothyroxine to her [facility], they have not received it yet and she is running low.

## 2016-01-11 NOTE — Telephone Encounter (Signed)
Refill sent to [facility] 12/6

## 2016-01-17 DIAGNOSIS — M7541 Impingement syndrome of right shoulder: Secondary | ICD-10-CM | POA: Diagnosis not present

## 2016-01-17 DIAGNOSIS — M79644 Pain in right finger(s): Secondary | ICD-10-CM | POA: Diagnosis not present

## 2016-01-17 DIAGNOSIS — M25511 Pain in right shoulder: Secondary | ICD-10-CM | POA: Diagnosis not present

## 2016-01-17 DIAGNOSIS — M65311 Trigger thumb, right thumb: Secondary | ICD-10-CM | POA: Diagnosis not present

## 2016-01-20 DIAGNOSIS — M25511 Pain in right shoulder: Secondary | ICD-10-CM | POA: Diagnosis not present

## 2016-01-20 DIAGNOSIS — I10 Essential (primary) hypertension: Secondary | ICD-10-CM | POA: Diagnosis not present

## 2016-01-20 DIAGNOSIS — R5383 Other fatigue: Secondary | ICD-10-CM | POA: Diagnosis not present

## 2016-01-20 DIAGNOSIS — Z6835 Body mass index (BMI) 35.0-35.9, adult: Secondary | ICD-10-CM | POA: Diagnosis not present

## 2016-01-20 DIAGNOSIS — Z79899 Other long term (current) drug therapy: Secondary | ICD-10-CM | POA: Diagnosis not present

## 2016-01-23 DIAGNOSIS — L570 Actinic keratosis: Secondary | ICD-10-CM | POA: Diagnosis not present

## 2016-01-23 DIAGNOSIS — D1800 Hemangioma unspecified site: Secondary | ICD-10-CM | POA: Diagnosis not present

## 2016-01-23 DIAGNOSIS — D239 Other benign neoplasm of skin, unspecified: Secondary | ICD-10-CM | POA: Diagnosis not present

## 2016-01-24 DIAGNOSIS — G4733 Obstructive sleep apnea (adult) (pediatric): Secondary | ICD-10-CM | POA: Diagnosis not present

## 2016-01-27 DIAGNOSIS — I10 Essential (primary) hypertension: Secondary | ICD-10-CM | POA: Diagnosis not present

## 2016-01-27 DIAGNOSIS — M545 Low back pain: Secondary | ICD-10-CM | POA: Diagnosis not present

## 2016-02-23 DIAGNOSIS — M545 Low back pain: Secondary | ICD-10-CM | POA: Diagnosis not present

## 2016-02-23 DIAGNOSIS — I10 Essential (primary) hypertension: Secondary | ICD-10-CM | POA: Diagnosis not present

## 2016-02-27 ENCOUNTER — Other Ambulatory Visit: Payer: Self-pay | Admitting: Internal Medicine

## 2016-03-19 DIAGNOSIS — M545 Low back pain: Secondary | ICD-10-CM | POA: Diagnosis not present

## 2016-03-19 DIAGNOSIS — I10 Essential (primary) hypertension: Secondary | ICD-10-CM | POA: Diagnosis not present

## 2016-03-23 DIAGNOSIS — Z1231 Encounter for screening mammogram for malignant neoplasm of breast: Secondary | ICD-10-CM | POA: Diagnosis not present

## 2016-04-25 DIAGNOSIS — M545 Low back pain: Secondary | ICD-10-CM | POA: Diagnosis not present

## 2016-04-25 DIAGNOSIS — I10 Essential (primary) hypertension: Secondary | ICD-10-CM | POA: Diagnosis not present

## 2016-05-17 ENCOUNTER — Ambulatory Visit: Payer: Commercial Managed Care - HMO | Admitting: Internal Medicine

## 2016-05-21 ENCOUNTER — Encounter: Payer: Self-pay | Admitting: Internal Medicine

## 2016-05-21 ENCOUNTER — Ambulatory Visit (INDEPENDENT_AMBULATORY_CARE_PROVIDER_SITE_OTHER): Payer: Medicare HMO | Admitting: Internal Medicine

## 2016-05-21 VITALS — BP 118/72 | HR 71 | Temp 97.4°F | Wt 191.2 lb

## 2016-05-21 DIAGNOSIS — E039 Hypothyroidism, unspecified: Secondary | ICD-10-CM | POA: Diagnosis not present

## 2016-05-21 LAB — TSH: TSH: 3.71 u[IU]/mL (ref 0.35–4.50)

## 2016-05-21 LAB — T4, FREE: FREE T4: 1.45 ng/dL (ref 0.60–1.60)

## 2016-05-21 NOTE — Progress Notes (Signed)
Patient ID: Karen Schultz, female   DOB: 06-Aug-1945, 71 y.o.   MRN: [id number]    HPI  Karen Schultz is a 71 y.o.-year-old female, initially referred by her cardiologist, Dr. Skeet Latch, returning for f/u for hypothyroidism. Last visit 6 mo ago.  He started shakes and supplement by her chiropractor.   Reviewed and addended hx: Pt. has been dx with hypothyroidism in 1990s in [place] >> on Synthroid initially for a long time >> now on Levothyroxine.  She takes the thyroid hormone (we increased LT4 to 112 mcg daily): - + fasting - with water - separated by ~30 min or less from b'fast  (protein shake) - + calcium at lunch - no iron, PPIs, multivitamins   Reviewed TFTs: Lab Results  Component Value Date   TSH 2.26 12/26/2015   TSH 6.55 (H) 11/17/2015   FREET4 1.41 12/26/2015   FREET4 0.96 11/17/2015   Pt describes: - + weight loss - intentional - no fatigue - No heat or cold intolerance - No anxiety or depression - no constipation - no dry skin - no hair loss  Pt denies feeling nodules in neck, hoarseness, dysphagia/odynophagia, SOB with lying down.  She has no FH of thyroid disorders. No FH of thyroid cancer.  No h/o radiation tx to head or neck. No recent use of iodine supplements. Not on Biotin.  Pt. also has a history of B12 def, HTN, obstructive sleep apnea.  Her right hand was in a brace at last visit << tendinitis from crocheting too much.  She exercises 3-4 times a week: Hand weights, ball, walking, yoga.  ROS: Constitutional: + see history of present illness Eyes: no blurry vision, no xerophthalmia ENT: no sore throat, + see history of present illness Cardiovascular: no CP/ SOB/no palpitations/+ leg swelling Respiratory: no cough/ SOB Gastrointestinal: no N/V/D/ C/heartburn  Musculoskeletal: no muscle/joint aches Skin: no rashes Neurological: no tremors/numbness/tingling/dizziness  I reviewed pt's medications, allergies, PMH, social hx, family hx, and  changes were documented in the history of present illness. Otherwise, unchanged from my initial visit note.  Past Medical History:  Diagnosis Date  . Lower extremity edema 09/26/2015  . OSA on CPAP    5-6 years (as of 09/2014)   Past surgical history: - C-section in 1977 and 1979 - Tubal ligation  Social History   Social History  . Marital status: Married    Spouse name: N/A  . Number of children: 1   Occupational History  . Homemaker    Social History Main Topics  . Smoking status: Never Smoker  . Smokeless tobacco: Never Used  . Alcohol use 0.0 oz/week     Comment: socially  . Drug use: No   Current Outpatient Prescriptions on File Prior to Visit  Medication Sig Dispense Refill  . benazepril-hydrochlorthiazide (LOTENSIN HCT) 20-25 MG per tablet Take 1 tablet by mouth daily.    . fluticasone (FLONASE) 50 MCG/ACT nasal spray Place into both nostrils as needed for allergies or rhinitis.    Marland Kitchen levothyroxine (SYNTHROID, LEVOTHROID) 112 MCG tablet TAKE 1 TABLET DAILY BEFORE BREAKFAST. 90 tablet 1  . NON FORMULARY at bedtime. CPAP    . RA KRILL OIL PO Take 1 capsule by mouth daily.    . vitamin B-12 (CYANOCOBALAMIN) 1000 MCG tablet Take 1,000 mcg by mouth daily.     No current facility-administered medications on file prior to visit.    Allergies  Allergen Reactions  . Codeine Nausea And Vomiting  . Sulfur  headache   No family history on file.  PE: BP 118/72 (BP Location: Left Arm, Patient Position: Sitting, Cuff Size: Normal)   Pulse 71   Temp 97.4 F (36.3 C) (Oral)   Wt 191 lb 3.2 oz (86.7 kg)   SpO2 97%   BMI 36.73 kg/m  Wt Readings from Last 3 Encounters:  05/21/16 191 lb 3.2 oz (86.7 kg)  11/17/15 199 lb (90.3 kg)  09/26/15 203 lb 6.4 oz (92.3 kg)   Constitutional: overweight, in NAD Eyes: PERRLA, EOMI, no exophthalmos ENT: moist mucous membranes, no thyromegaly, no cervical lymphadenopathy Cardiovascular: RRR, No MRG Respiratory: CTA  B Gastrointestinal: abdomen soft, NT, ND, BS+ Musculoskeletal: no deformities, strength intact in all 4 Skin: moist, warm, no rashes Neurological: no tremor with outstretched hands, DTR normal in all 4  ASSESSMENT: 1. Hypothyroidism  PLAN:  1. Patient with long-standing hypothyroidism, on levothyroxine therapy, with normalized TFTs after increasing LT4 to 112 mcg at last visit. She continues on this dose. Feels well on it.   - we will recheck TFTs today. We will change the dose of levothyroxine depending on her labs.  - she appears euthyroid - she does not appear to have a goiter, thyroid nodules, or neck compression symptoms - We discussed about correct intake of levothyroxine, fasting, with water, separated by at least 30 minutes from breakfast, and separated by more than 4 hours from calcium, iron, multivitamins, acid reflux medications (PPIs). She is taking it correctly. - Will continue the current dose of levothyroxine for now, 112 g daily. - If labs today are abnormal, she will need to return in 6 weeks for repeat labs - Otherwise, I will see her back in 1 year  Component     Latest Ref Rng & Units 05/21/2016  T4,Free(Direct)     0.60 - 1.60 ng/dL 1.45  TSH     0.35 - 4.50 uIU/mL 3.71   TFTs normal.  Philemon Kingdom, MD PhD [facility]

## 2016-05-21 NOTE — Patient Instructions (Signed)
Please stop at the lab.  Please continue Levothyroxine 112 mcg daily.  Take the thyroid hormone every day, with water, at least 30 minutes before breakfast, separated by at least 4 hours from: - acid reflux medications - calcium - iron - multivitamins  Please come back for a follow-up appointment in 1 year. 

## 2016-05-28 ENCOUNTER — Telehealth: Payer: Self-pay

## 2016-05-28 NOTE — Telephone Encounter (Signed)
-----   Message from Philemon Kingdom, MD sent at 05/21/2016  5:43 PM EDT ----- Karen Schultz, can you please call pt: TFTs normal >> please continue the current dose of levothyroxine.

## 2016-05-28 NOTE — Telephone Encounter (Signed)
LVM, gave lab results. Gave call back number if any questions or concerns.  

## 2016-06-15 DIAGNOSIS — G4733 Obstructive sleep apnea (adult) (pediatric): Secondary | ICD-10-CM | POA: Diagnosis not present

## 2016-06-20 DIAGNOSIS — G4733 Obstructive sleep apnea (adult) (pediatric): Secondary | ICD-10-CM | POA: Diagnosis not present

## 2016-06-21 DIAGNOSIS — I10 Essential (primary) hypertension: Secondary | ICD-10-CM | POA: Diagnosis not present

## 2016-06-21 DIAGNOSIS — M545 Low back pain: Secondary | ICD-10-CM | POA: Diagnosis not present

## 2016-07-20 DIAGNOSIS — E038 Other specified hypothyroidism: Secondary | ICD-10-CM | POA: Diagnosis not present

## 2016-07-20 DIAGNOSIS — M25511 Pain in right shoulder: Secondary | ICD-10-CM | POA: Diagnosis not present

## 2016-07-20 DIAGNOSIS — I10 Essential (primary) hypertension: Secondary | ICD-10-CM | POA: Diagnosis not present

## 2016-07-20 DIAGNOSIS — Z6832 Body mass index (BMI) 32.0-32.9, adult: Secondary | ICD-10-CM | POA: Diagnosis not present

## 2016-07-24 ENCOUNTER — Other Ambulatory Visit: Payer: Self-pay | Admitting: Internal Medicine

## 2016-10-02 ENCOUNTER — Ambulatory Visit (INDEPENDENT_AMBULATORY_CARE_PROVIDER_SITE_OTHER): Payer: Medicare HMO | Admitting: Cardiovascular Disease

## 2016-10-02 ENCOUNTER — Encounter: Payer: Self-pay | Admitting: Cardiovascular Disease

## 2016-10-02 VITALS — BP 114/65 | HR 70 | Ht 61.0 in | Wt 180.4 lb

## 2016-10-02 DIAGNOSIS — I83899 Varicose veins of unspecified lower extremities with other complications: Secondary | ICD-10-CM

## 2016-10-02 DIAGNOSIS — R0602 Shortness of breath: Secondary | ICD-10-CM

## 2016-10-02 DIAGNOSIS — R600 Localized edema: Secondary | ICD-10-CM

## 2016-10-02 DIAGNOSIS — I10 Essential (primary) hypertension: Secondary | ICD-10-CM | POA: Diagnosis not present

## 2016-10-02 NOTE — Patient Instructions (Signed)
Medication Instructions:  Your physician recommends that you continue on your current medications as directed. Please refer to the Current Medication list given to you today.  Labwork: none  Testing/Procedures: none  Follow-Up: Your physician wants you to follow-up in: 1 year ov You will receive a reminder letter in the mail two months in advance. If you don't receive a letter, please call our office to schedule the follow-up appointment.  You have been referred to [facility] IF YOU DO NOT HEAR FROM THE OFFICE CONTACT THEM DIRECTLY AT [phone]  If you need a refill on your cardiac medications before your next appointment, please call your pharmacy.

## 2016-10-02 NOTE — Progress Notes (Signed)
Cardiology Office Note   Date:  10/02/2016   ID:  Karen Schultz, DOB [birth year], MRN [id number]  PCP:  Neale Burly, MD  Cardiologist:   Skeet Latch, MD   Chief Complaint  Patient presents with  . Follow-up    12 months;      History of Present Illness: [name] is a 72 y.o. female OSA and HTN who presents for follow up.  Karen Schultz was first seen 09/2014 with lower extremity edema and atypical chest pain.  Her edema has been occurring intermittently for 3 years and was felt to be due to venous insufficiency.  BNP was 35.  She was referred for an echo 10/07/15 that revealed LVEF 60-65% and grade 1 diastolic dysfunction. There were no significant valvular abnormalities She also reported aching in the right side of her chest when walking up an incline and was referred for ETT 09/24/14 that was negative for ischemia.  She achieved 7 METS on the Bruce protocol.  She reported claudication and underwent ABIs 10/06/15 that were negative bilaterally.  She continues to have lower extremity edema that is worse When sitting or standing for prolonged periods of time. She has no orthopnea or PND. She hasn't been exercising quite as much lately because she is been traveling back and forth to [place] to visit her daughter who recently had surgery. She has no exertional symptoms. Karen Schultz is been working with the nutritionist and has lost over 20 pounds in the last year.  She reports that her cholesterol was recently checked with her PCP.  Past Medical History:  Diagnosis Date  . Lower extremity edema 09/26/2015  . OSA on CPAP    5-6 years (as of 09/2014)    No past surgical history on file.   Current Outpatient Prescriptions  Medication Sig Dispense Refill  . benazepril-hydrochlorthiazide (LOTENSIN HCT) 20-25 MG per tablet Take 1 tablet by mouth daily.    . fluticasone (FLONASE) 50 MCG/ACT nasal spray Place into both nostrils as needed for allergies or rhinitis.    Marland Kitchen  levothyroxine (SYNTHROID, LEVOTHROID) 112 MCG tablet TAKE 1 TABLET DAILY BEFORE BREAKFAST 90 tablet 1  . NON FORMULARY at bedtime. CPAP    . Probiotic Product (PROBIOTIC DAILY PO) Take by mouth.    . RA KRILL OIL PO Take 1 capsule by mouth daily.    . vitamin B-12 (CYANOCOBALAMIN) 1000 MCG tablet Take 1,000 mcg by mouth daily.     No current facility-administered medications for this visit.     Allergies:   Codeine and Sulfur    Social History:  The patient  reports that she has never smoked. She has never used smokeless tobacco. She reports that she drinks alcohol. She reports that she does not use drugs.   Family History:  The patient's family history is not on file.    ROS:  Please see the history of present illness.   Otherwise, review of systems are positive for none.   All other systems are reviewed and negative.    PHYSICAL EXAM: VS:  BP 114/65   Pulse 70   Ht 5\' 1"  (1.549 m)   Wt 81.8 kg (180 lb 6.4 oz)   BMI 34.09 kg/m  , BMI Body mass index is 34.09 kg/m. GENERAL:  Well appearing. No acute distress HEENT: Pupils equal round and reactive, fundi not visualized, oral mucosa unremarkable NECK:  No jugular venous distention, waveform within normal limits, carotid upstroke brisk and symmetric, no bruits LUNGS:  Clear to auscultation bilaterally HEART:  RRR.  PMI not displaced or sustained,S1 and S2 within normal limits, no S3, no S4, no clicks, no rubs, no murmurs ABD:  Flat, positive bowel sounds normal in frequency in pitch, no bruits, no rebound, no guarding, no midline pulsatile mass, no hepatomegaly, no splenomegaly EXT:  2 plus pulses throughout, 1+ non-pitting edema, no cyanosis no clubbing SKIN:  No rashes no nodules NEURO:  Cranial nerves II through XII grossly intact, motor grossly intact throughout PSYCH:  Cognitively intact, oriented to person place and time   EKG:  EKG is ordered today. 09/26/15: Sinus rhythm rate 73 bpm.  Borderline R axis deviation 08/10/14:  Sinus rhythm.  Borderline R axis deviation. 10/02/16: Sinus rhythm.  Rate 70 bpm.    ABIs 10/06/15: Normal bilaterally  Echo 10/07/15: Study Conclusions  - Left ventricle: The cavity size was normal. There was mild focal   basal hypertrophy of the septum. Systolic function was normal.   The estimated ejection fraction was in the range of 60% to 65%.   Wall motion was normal; there were no regional wall motion   abnormalities. Doppler parameters are consistent with abnormal   left ventricular relaxation (grade 1 diastolic dysfunction). The   E/e&' ratio is between 8-15, suggesting indeterminate LV filling   pressure. - Aortic valve: Trileaflet. Sclerosis without stenosis. There was   trivial regurgitation. - Mitral valve: Calcified annulus. Mildly thickened leaflets .   There was trivial regurgitation. - Left atrium: The atrium was normal in size. - Right ventricle: The cavity size was normal. Wall thickness was   normal. Systolic function was low normal. - Right atrium: The atrium was normal in size. - Tricuspid valve: There was trivial regurgitation. - Pulmonary arteries: PA peak pressure: 23 mm Hg (S). - Inferior vena cava: The vessel was normal in size. The   respirophasic diameter changes were in the normal range (>= 50%),   consistent with normal central venous pressure.   Recent Labs: 05/21/2016: TSH 3.71    Lipid Panel No results found for: CHOL, TRIG, HDL, CHOLHDL, VLDL, LDLCALC, LDLDIRECT    Wt Readings from Last 3 Encounters:  10/02/16 81.8 kg (180 lb 6.4 oz)  05/21/16 86.7 kg (191 lb 3.2 oz)  11/17/15 90.3 kg (199 lb)      Other studies Reviewed: Additional studies/ records that were reviewed today include: EKG Review of the above records demonstrates:  Please see elsewhere in the note.   Lipid panel 08/10/14: chol 153, tri 158, hdl 46, ldl 75  Creatinine 0.85 Carotid arteries: L PSV <110cm/s, R PSV <110cm/s R ABI 1.04, L ABI 1.03  ASSESSMENT AND  PLAN:  # Hypertension: BP well-controlled.  Continue benazepril/HCTZ.  She would like to stop taking her blood pressure medication. We discussed the fact that she actually has 2 pills in this tablet, and it is unlikely that stopping the entire tablet would be good for her blood pressure. We could potentially stop benazepril. However, given that she still has to take one medication she is not interested in making this change at this time. If she gets any lightheadedness or dizziness we will stop benazepril prior to hydrochlorothiazide given that she has issues with lower extremity edema.  # LE edema:  # Exertional dyspnea:  Exam and history are not consistent with heart failure.   Echo was unremarkable and she has no evidence of DVT.  This is likely due to venous insufficiency. She also has varicose veins. We will refer  her to the vascular.  I have recommended that she wear compression stockings, especially when she will be going on long car rides.  # Leg pain: ABIs were negative.  This is not due to arteriovascular disease.   Current medicines are reviewed at length with the patient today.  The patient does not have concerns regarding medicines.  The following changes have been made:  no change  Labs/ tests ordered today include:   Orders Placed This Encounter  Procedures  . Ambulatory referral to Vascular Surgery  . EKG 12-Lead     Disposition:   FU with Oval Linsey in 1 year   Signed, Skeet Latch, MD  10/02/2016 11:28 AM    [place]

## 2016-10-10 ENCOUNTER — Other Ambulatory Visit: Payer: Self-pay

## 2016-10-10 DIAGNOSIS — I83899 Varicose veins of unspecified lower extremities with other complications: Secondary | ICD-10-CM

## 2016-10-19 DIAGNOSIS — Z6831 Body mass index (BMI) 31.0-31.9, adult: Secondary | ICD-10-CM | POA: Diagnosis not present

## 2016-10-19 DIAGNOSIS — M545 Low back pain: Secondary | ICD-10-CM | POA: Diagnosis not present

## 2016-10-31 DIAGNOSIS — N390 Urinary tract infection, site not specified: Secondary | ICD-10-CM | POA: Diagnosis not present

## 2016-11-16 ENCOUNTER — Encounter: Payer: Medicare HMO | Admitting: Vascular Surgery

## 2016-11-16 ENCOUNTER — Encounter (HOSPITAL_COMMUNITY): Payer: Medicare HMO

## 2016-11-23 ENCOUNTER — Encounter: Payer: Self-pay | Admitting: Vascular Surgery

## 2016-11-23 ENCOUNTER — Ambulatory Visit (INDEPENDENT_AMBULATORY_CARE_PROVIDER_SITE_OTHER): Payer: Medicare HMO | Admitting: Vascular Surgery

## 2016-11-23 ENCOUNTER — Ambulatory Visit (HOSPITAL_COMMUNITY)
Admission: RE | Admit: 2016-11-23 | Discharge: 2016-11-23 | Disposition: A | Discharge status: Home / Self Care | Payer: Medicare HMO | Source: Ambulatory Visit | Attending: Vascular Surgery | Admitting: Vascular Surgery

## 2016-11-23 VITALS — BP 125/72 | HR 74 | Temp 98.0°F | Resp 18 | Ht 61.5 in | Wt 178.9 lb

## 2016-11-23 DIAGNOSIS — I83899 Varicose veins of unspecified lower extremities with other complications: Secondary | ICD-10-CM

## 2016-11-23 DIAGNOSIS — I872 Venous insufficiency (chronic) (peripheral): Secondary | ICD-10-CM

## 2016-11-23 DIAGNOSIS — R609 Edema, unspecified: Secondary | ICD-10-CM | POA: Diagnosis not present

## 2016-11-23 NOTE — Progress Notes (Signed)
Patient ID: Karen Schultz, female   DOB: 1945-09-01, 71 y.o.   MRN: [id number]  Reason for Consult: new evaluation (c/o bilateral leg swelling and achiness (calves, ankles) and discoloration bilateral ankles (L>R)  ) and Varicose Veins   Referred by Karen Burly, MD  Subjective:     HPI:  Karen Schultz is a 71 y.o. female with history of hypertension presents for lower extremity edema that is in her bilateral lower extremities right greater than left. She does have some spider veins on the left is never had varicose veins. She does not have a history of DVT. States she first noted swelling 4 years ago when she was pregnant. It is worsened with time. She has recently lost a few pounds and this has improved. She does not wear compression stocking regularly and does not elevate her legs regularly. She does today or from they are dependent for several hours. She has never had rest pain or tissue loss. She does complain of cramping and frequently at night in her bilateral lower extremity.  Past Medical History:  Diagnosis Date  . Hypertension   . Lower extremity edema 09/26/2015  . OSA on CPAP    5-6 years (as of 09/2014)  . Thyroid disease    History reviewed. No pertinent family history. History reviewed. No pertinent surgical history.  Short Social History:  Social History  Substance Use Topics  . Smoking status: Never Smoker  . Smokeless tobacco: Never Used  . Alcohol use No     Comment: socially    Allergies  Allergen Reactions  . Codeine Nausea And Vomiting  . Sulfur     headache    Current Outpatient Prescriptions  Medication Sig Dispense Refill  . benazepril-hydrochlorthiazide (LOTENSIN HCT) 20-25 MG per tablet Take 1 tablet by mouth daily.    . fluticasone (FLONASE) 50 MCG/ACT nasal spray Place into both nostrils as needed for allergies or rhinitis.    Marland Kitchen levothyroxine (SYNTHROID, LEVOTHROID) 112 MCG tablet TAKE 1 TABLET DAILY BEFORE BREAKFAST 90 tablet 1  . NON  FORMULARY at bedtime. CPAP    . Probiotic Product (PROBIOTIC DAILY PO) Take by mouth.    . RA KRILL OIL PO Take 1 capsule by mouth daily.    . vitamin B-12 (CYANOCOBALAMIN) 1000 MCG tablet Take 1,000 mcg by mouth daily.     No current facility-administered medications for this visit.     Review of Systems  Constitutional:  Constitutional negative. HENT: HENT negative.  Eyes: Eyes negative.  Respiratory: Respiratory negative.  Cardiovascular: Positive for leg swelling.  GI: Gastrointestinal negative.  Musculoskeletal: Musculoskeletal negative.  Skin: Skin negative.  Neurological: Neurological negative. Hematologic: Hematologic/lymphatic negative.  Psychiatric: Psychiatric negative.        Objective:  Objective   Vitals:   11/23/16 1357  BP: 125/72  Pulse: 74  Resp: 18  Temp: 98 F (36.7 C)  TempSrc: Oral  SpO2: 99%  Weight: 178 lb 14.4 oz (81.1 kg)  Height: 5' 1.5" (1.562 m)   Body mass index is 33.26 kg/m.  Physical Exam  Constitutional: She is oriented to person, place, and time. She appears well-developed.  HENT:  Head: Normocephalic.  Eyes: Pupils are equal, round, and reactive to light.  Neck: Normal range of motion.  Cardiovascular: Normal rate.   Pulses:      Dorsalis pedis pulses are 2+ on the right side, and 2+ on the left side.  Pulmonary/Chest: Effort normal.  Abdominal: Soft. She exhibits no mass.  Musculoskeletal:  Spider veins scattered left leg  Neurological: She is alert and oriented to person, place, and time.  Skin: Skin is warm and dry.  Psychiatric: She has a normal mood and affect. Her behavior is normal. Judgment and thought content normal.    Data: I have independently interpreted her lower extremity venous duplex which demonstrates no DVT. She does have reflux in the right common femoral and femoral veins in the left common femoral popliteal veins. Right superficial system has saphenofemoral junction proximal great saphenous vein  reflux greatest diameter 0.67 cm in the proximal thigh below the knee is less than 0.3 cm. On the left side saphenofemoral junction greater saphenous vein and extended the knee has reflux in the size is 0.7 cm at the junction and below the knee is right at 0.4 cm.     Assessment/Plan:     71 year old female with multifactorial lower extremity edema with saphenous vein reflux in size on the left with associated spider veins that is possibly causing some of her swelling she does have some skin changes on the medial aspect of the leg to consider the C3 venous disease. We will get her fitted for compression stockings today I have counseled her on weight loss as well as elevation when recumbent. We will set her up an appointment for 3 months for follow-up in our vein center for possible consideration of left greater saphenous vein ablation.      Karen Sandy MD [facility]

## 2016-12-19 DIAGNOSIS — Z1211 Encounter for screening for malignant neoplasm of colon: Secondary | ICD-10-CM | POA: Diagnosis not present

## 2017-01-03 DIAGNOSIS — M47816 Spondylosis without myelopathy or radiculopathy, lumbar region: Secondary | ICD-10-CM | POA: Diagnosis not present

## 2017-01-03 DIAGNOSIS — S338XXA Sprain of other parts of lumbar spine and pelvis, initial encounter: Secondary | ICD-10-CM | POA: Diagnosis not present

## 2017-01-03 DIAGNOSIS — M9903 Segmental and somatic dysfunction of lumbar region: Secondary | ICD-10-CM | POA: Diagnosis not present

## 2017-01-07 DIAGNOSIS — M47816 Spondylosis without myelopathy or radiculopathy, lumbar region: Secondary | ICD-10-CM | POA: Diagnosis not present

## 2017-01-07 DIAGNOSIS — M9903 Segmental and somatic dysfunction of lumbar region: Secondary | ICD-10-CM | POA: Diagnosis not present

## 2017-01-07 DIAGNOSIS — S338XXA Sprain of other parts of lumbar spine and pelvis, initial encounter: Secondary | ICD-10-CM | POA: Diagnosis not present

## 2017-01-17 DIAGNOSIS — S338XXA Sprain of other parts of lumbar spine and pelvis, initial encounter: Secondary | ICD-10-CM | POA: Diagnosis not present

## 2017-01-17 DIAGNOSIS — M47816 Spondylosis without myelopathy or radiculopathy, lumbar region: Secondary | ICD-10-CM | POA: Diagnosis not present

## 2017-01-17 DIAGNOSIS — M9903 Segmental and somatic dysfunction of lumbar region: Secondary | ICD-10-CM | POA: Diagnosis not present

## 2017-01-21 DIAGNOSIS — M9903 Segmental and somatic dysfunction of lumbar region: Secondary | ICD-10-CM | POA: Diagnosis not present

## 2017-01-21 DIAGNOSIS — M545 Low back pain: Secondary | ICD-10-CM | POA: Diagnosis not present

## 2017-01-21 DIAGNOSIS — M47816 Spondylosis without myelopathy or radiculopathy, lumbar region: Secondary | ICD-10-CM | POA: Diagnosis not present

## 2017-01-21 DIAGNOSIS — Z6831 Body mass index (BMI) 31.0-31.9, adult: Secondary | ICD-10-CM | POA: Diagnosis not present

## 2017-01-21 DIAGNOSIS — I10 Essential (primary) hypertension: Secondary | ICD-10-CM | POA: Diagnosis not present

## 2017-01-21 DIAGNOSIS — S338XXA Sprain of other parts of lumbar spine and pelvis, initial encounter: Secondary | ICD-10-CM | POA: Diagnosis not present

## 2017-01-23 DIAGNOSIS — M9903 Segmental and somatic dysfunction of lumbar region: Secondary | ICD-10-CM | POA: Diagnosis not present

## 2017-01-23 DIAGNOSIS — L821 Other seborrheic keratosis: Secondary | ICD-10-CM | POA: Diagnosis not present

## 2017-01-23 DIAGNOSIS — D1800 Hemangioma unspecified site: Secondary | ICD-10-CM | POA: Diagnosis not present

## 2017-01-23 DIAGNOSIS — S338XXA Sprain of other parts of lumbar spine and pelvis, initial encounter: Secondary | ICD-10-CM | POA: Diagnosis not present

## 2017-01-23 DIAGNOSIS — L570 Actinic keratosis: Secondary | ICD-10-CM | POA: Diagnosis not present

## 2017-01-23 DIAGNOSIS — M47816 Spondylosis without myelopathy or radiculopathy, lumbar region: Secondary | ICD-10-CM | POA: Diagnosis not present

## 2017-01-25 DIAGNOSIS — M47816 Spondylosis without myelopathy or radiculopathy, lumbar region: Secondary | ICD-10-CM | POA: Diagnosis not present

## 2017-01-25 DIAGNOSIS — S338XXA Sprain of other parts of lumbar spine and pelvis, initial encounter: Secondary | ICD-10-CM | POA: Diagnosis not present

## 2017-01-25 DIAGNOSIS — M9903 Segmental and somatic dysfunction of lumbar region: Secondary | ICD-10-CM | POA: Diagnosis not present

## 2017-02-01 DIAGNOSIS — S338XXA Sprain of other parts of lumbar spine and pelvis, initial encounter: Secondary | ICD-10-CM | POA: Diagnosis not present

## 2017-02-01 DIAGNOSIS — M47816 Spondylosis without myelopathy or radiculopathy, lumbar region: Secondary | ICD-10-CM | POA: Diagnosis not present

## 2017-02-01 DIAGNOSIS — M9903 Segmental and somatic dysfunction of lumbar region: Secondary | ICD-10-CM | POA: Diagnosis not present

## 2017-02-06 DIAGNOSIS — M9903 Segmental and somatic dysfunction of lumbar region: Secondary | ICD-10-CM | POA: Diagnosis not present

## 2017-02-06 DIAGNOSIS — S338XXA Sprain of other parts of lumbar spine and pelvis, initial encounter: Secondary | ICD-10-CM | POA: Diagnosis not present

## 2017-02-06 DIAGNOSIS — M47816 Spondylosis without myelopathy or radiculopathy, lumbar region: Secondary | ICD-10-CM | POA: Diagnosis not present

## 2017-02-07 DIAGNOSIS — M542 Cervicalgia: Secondary | ICD-10-CM | POA: Diagnosis not present

## 2017-02-07 DIAGNOSIS — M47812 Spondylosis without myelopathy or radiculopathy, cervical region: Secondary | ICD-10-CM | POA: Diagnosis not present

## 2017-02-08 DIAGNOSIS — M47816 Spondylosis without myelopathy or radiculopathy, lumbar region: Secondary | ICD-10-CM | POA: Diagnosis not present

## 2017-02-08 DIAGNOSIS — S338XXA Sprain of other parts of lumbar spine and pelvis, initial encounter: Secondary | ICD-10-CM | POA: Diagnosis not present

## 2017-02-08 DIAGNOSIS — M9903 Segmental and somatic dysfunction of lumbar region: Secondary | ICD-10-CM | POA: Diagnosis not present

## 2017-02-11 DIAGNOSIS — M85851 Other specified disorders of bone density and structure, right thigh: Secondary | ICD-10-CM | POA: Diagnosis not present

## 2017-02-11 DIAGNOSIS — M8589 Other specified disorders of bone density and structure, multiple sites: Secondary | ICD-10-CM | POA: Diagnosis not present

## 2017-02-11 DIAGNOSIS — M810 Age-related osteoporosis without current pathological fracture: Secondary | ICD-10-CM | POA: Diagnosis not present

## 2017-02-11 DIAGNOSIS — M85852 Other specified disorders of bone density and structure, left thigh: Secondary | ICD-10-CM | POA: Diagnosis not present

## 2017-02-11 DIAGNOSIS — M9903 Segmental and somatic dysfunction of lumbar region: Secondary | ICD-10-CM | POA: Diagnosis not present

## 2017-02-11 DIAGNOSIS — Z780 Asymptomatic menopausal state: Secondary | ICD-10-CM | POA: Diagnosis not present

## 2017-02-11 DIAGNOSIS — S338XXA Sprain of other parts of lumbar spine and pelvis, initial encounter: Secondary | ICD-10-CM | POA: Diagnosis not present

## 2017-02-11 DIAGNOSIS — M8588 Other specified disorders of bone density and structure, other site: Secondary | ICD-10-CM | POA: Diagnosis not present

## 2017-02-11 DIAGNOSIS — M47816 Spondylosis without myelopathy or radiculopathy, lumbar region: Secondary | ICD-10-CM | POA: Diagnosis not present

## 2017-02-13 DIAGNOSIS — S338XXA Sprain of other parts of lumbar spine and pelvis, initial encounter: Secondary | ICD-10-CM | POA: Diagnosis not present

## 2017-02-13 DIAGNOSIS — M47816 Spondylosis without myelopathy or radiculopathy, lumbar region: Secondary | ICD-10-CM | POA: Diagnosis not present

## 2017-02-13 DIAGNOSIS — M9903 Segmental and somatic dysfunction of lumbar region: Secondary | ICD-10-CM | POA: Diagnosis not present

## 2017-03-01 ENCOUNTER — Other Ambulatory Visit: Payer: Self-pay | Admitting: Internal Medicine

## 2017-03-12 ENCOUNTER — Ambulatory Visit: Payer: Medicare HMO | Admitting: Vascular Surgery

## 2017-03-14 DIAGNOSIS — I700 Atherosclerosis of aorta: Secondary | ICD-10-CM | POA: Diagnosis not present

## 2017-03-14 DIAGNOSIS — M47814 Spondylosis without myelopathy or radiculopathy, thoracic region: Secondary | ICD-10-CM | POA: Diagnosis not present

## 2017-03-14 DIAGNOSIS — M545 Low back pain: Secondary | ICD-10-CM | POA: Diagnosis not present

## 2017-03-14 DIAGNOSIS — M47816 Spondylosis without myelopathy or radiculopathy, lumbar region: Secondary | ICD-10-CM | POA: Diagnosis not present

## 2017-03-14 DIAGNOSIS — M4804 Spinal stenosis, thoracic region: Secondary | ICD-10-CM | POA: Diagnosis not present

## 2017-03-18 DIAGNOSIS — N3000 Acute cystitis without hematuria: Secondary | ICD-10-CM | POA: Diagnosis not present

## 2017-03-18 DIAGNOSIS — M5489 Other dorsalgia: Secondary | ICD-10-CM | POA: Diagnosis not present

## 2017-03-25 DIAGNOSIS — Z1231 Encounter for screening mammogram for malignant neoplasm of breast: Secondary | ICD-10-CM | POA: Diagnosis not present

## 2017-03-28 DIAGNOSIS — Z0000 Encounter for general adult medical examination without abnormal findings: Secondary | ICD-10-CM | POA: Diagnosis not present

## 2017-04-08 DIAGNOSIS — G4733 Obstructive sleep apnea (adult) (pediatric): Secondary | ICD-10-CM | POA: Diagnosis not present

## 2017-04-08 DIAGNOSIS — G4721 Circadian rhythm sleep disorder, delayed sleep phase type: Secondary | ICD-10-CM | POA: Diagnosis not present

## 2017-04-25 DIAGNOSIS — S338XXA Sprain of other parts of lumbar spine and pelvis, initial encounter: Secondary | ICD-10-CM | POA: Diagnosis not present

## 2017-04-25 DIAGNOSIS — M47816 Spondylosis without myelopathy or radiculopathy, lumbar region: Secondary | ICD-10-CM | POA: Diagnosis not present

## 2017-04-25 DIAGNOSIS — M9903 Segmental and somatic dysfunction of lumbar region: Secondary | ICD-10-CM | POA: Diagnosis not present

## 2017-04-29 DIAGNOSIS — M47816 Spondylosis without myelopathy or radiculopathy, lumbar region: Secondary | ICD-10-CM | POA: Diagnosis not present

## 2017-04-29 DIAGNOSIS — M9903 Segmental and somatic dysfunction of lumbar region: Secondary | ICD-10-CM | POA: Diagnosis not present

## 2017-04-29 DIAGNOSIS — S338XXA Sprain of other parts of lumbar spine and pelvis, initial encounter: Secondary | ICD-10-CM | POA: Diagnosis not present

## 2017-05-02 DIAGNOSIS — S338XXA Sprain of other parts of lumbar spine and pelvis, initial encounter: Secondary | ICD-10-CM | POA: Diagnosis not present

## 2017-05-02 DIAGNOSIS — M47816 Spondylosis without myelopathy or radiculopathy, lumbar region: Secondary | ICD-10-CM | POA: Diagnosis not present

## 2017-05-02 DIAGNOSIS — M9903 Segmental and somatic dysfunction of lumbar region: Secondary | ICD-10-CM | POA: Diagnosis not present

## 2017-05-06 DIAGNOSIS — M9903 Segmental and somatic dysfunction of lumbar region: Secondary | ICD-10-CM | POA: Diagnosis not present

## 2017-05-06 DIAGNOSIS — G4733 Obstructive sleep apnea (adult) (pediatric): Secondary | ICD-10-CM | POA: Diagnosis not present

## 2017-05-06 DIAGNOSIS — S338XXA Sprain of other parts of lumbar spine and pelvis, initial encounter: Secondary | ICD-10-CM | POA: Diagnosis not present

## 2017-05-06 DIAGNOSIS — M47816 Spondylosis without myelopathy or radiculopathy, lumbar region: Secondary | ICD-10-CM | POA: Diagnosis not present

## 2017-05-06 DIAGNOSIS — G4721 Circadian rhythm sleep disorder, delayed sleep phase type: Secondary | ICD-10-CM | POA: Diagnosis not present

## 2017-05-09 DIAGNOSIS — S338XXA Sprain of other parts of lumbar spine and pelvis, initial encounter: Secondary | ICD-10-CM | POA: Diagnosis not present

## 2017-05-09 DIAGNOSIS — M47816 Spondylosis without myelopathy or radiculopathy, lumbar region: Secondary | ICD-10-CM | POA: Diagnosis not present

## 2017-05-09 DIAGNOSIS — M9903 Segmental and somatic dysfunction of lumbar region: Secondary | ICD-10-CM | POA: Diagnosis not present

## 2017-05-21 ENCOUNTER — Encounter: Payer: Self-pay | Admitting: Internal Medicine

## 2017-05-21 ENCOUNTER — Ambulatory Visit: Payer: Medicare HMO | Admitting: Internal Medicine

## 2017-05-21 ENCOUNTER — Telehealth: Payer: Self-pay

## 2017-05-21 VITALS — BP 144/86 | HR 77 | Ht 61.5 in | Wt 186.6 lb

## 2017-05-21 DIAGNOSIS — E039 Hypothyroidism, unspecified: Secondary | ICD-10-CM | POA: Diagnosis not present

## 2017-05-21 DIAGNOSIS — M8580 Other specified disorders of bone density and structure, unspecified site: Secondary | ICD-10-CM | POA: Insufficient documentation

## 2017-05-21 DIAGNOSIS — M8589 Other specified disorders of bone density and structure, multiple sites: Secondary | ICD-10-CM

## 2017-05-21 LAB — T4, FREE: Free T4: 1.3 ng/dL (ref 0.60–1.60)

## 2017-05-21 LAB — TSH: TSH: 1.9 u[IU]/mL (ref 0.35–4.50)

## 2017-05-21 MED ORDER — LEVOTHYROXINE SODIUM 112 MCG PO TABS: ORAL_TABLET | ORAL | 3 refills | Status: DC

## 2017-05-21 NOTE — Progress Notes (Signed)
Patient ID: Karen Schultz, female   DOB: [birth year], 72 y.o.   MRN: [id number]    HPI  Karen Schultz is a 72 y.o.-year-old female, initially referred by her cardiologist, Dr. Skeet Latch, returning for f/u for acquired hypothyroidism. Last visit 1 year ago.  She has low back pain and also neck pain. She had Xrays >> scoliosis and DJD. She also had a BMD checked (reviewed reports) >> improved from 2016.   Reviewed history: Pt. has been dx with hypothy[place] in 1990s in [place] >> on Synthroid initially for a long time >> now on generic levothyroxine  Pt is on levothyroxine 112 mcg daily, taken: - in am - fasting - at least 30 min from b'fast - no Fe, MVI, PPIs - + Ca at lunch - not on Biotin  Reviewed her TFTs: Lab Results  Component Value Date   TSH 3.71 05/21/2016   TSH 2.26 12/26/2015   TSH 6.55 (H) 11/17/2015   FREET4 1.45 05/21/2016   FREET4 1.41 12/26/2015   FREET4 0.96 11/17/2015   Pt denies: - feeling nodules in neck - hoarseness - dysphagia - choking - SOB with lying down  She has no FH of thyroid disorders. No FH of thyroid cancer. No h/o radiation tx to head or neck.  No seaweed or kelp. No recent contrast studies. No herbal supplements. No Biotin use. No recent steroids use.   Pt. also has a history of B12 deficiency, HTN, OSA.  She exercises 3-4 times a week: Hand weights, ball, walking, yoga.  She loves crochet.  She actually developed tendinitis from this last year.  ROS: Constitutional: + weight gain/no weight loss, no fatigue, no subjective hyperthermia, no subjective hypothermia Eyes: no blurry vision, no xerophthalmia ENT: no sore throat, + see HPI Cardiovascular: no CP/no SOB/no palpitations/no leg swelling Respiratory: no cough/no SOB/no wheezing Gastrointestinal: no N/no V/no D/no C/no acid reflux Musculoskeletal: + muscle aches/+ joint aches Skin: no rashes, no hair loss Neurological: no tremors/no numbness/no tingling/no dizziness  I  reviewed pt's medications, allergies, PMH, social hx, family hx, and changes were documented in the history of present illness. Otherwise, unchanged from my initial visit note.   Past Medical History:  Diagnosis Date  . Hypertension   . Lower extremity edema 09/26/2015  . OSA on CPAP    5-6 years (as of 09/2014)  . Thyroid disease    Past surgical history: - C-section in 1977 and 1979 - Tubal ligation  Social History   Social History  . Marital status: Married    Spouse name: N/A  . Number of children: 1   Occupational History  . Homemaker    Social History Main Topics  . Smoking status: Never Smoker  . Smokeless tobacco: Never Used  . Alcohol use 0.0 oz/week     Comment: socially  . Drug use: No   Current Outpatient Medications on File Prior to Visit  Medication Sig Dispense Refill  . benazepril-hydrochlorthiazide (LOTENSIN HCT) 20-25 MG per tablet Take 1 tablet by mouth daily.    . fluticasone (FLONASE) 50 MCG/ACT nasal spray Place into both nostrils as needed for allergies or rhinitis.    Marland Kitchen levothyroxine (SYNTHROID, LEVOTHROID) 112 MCG tablet TAKE 1 TABLET EVERY DAY BEFORE BREAKFAST 90 tablet 1  . NON FORMULARY at bedtime. CPAP    . Probiotic Product (PROBIOTIC DAILY PO) Take by mouth.    . RA KRILL OIL PO Take 1 capsule by mouth daily.    . vitamin B-12 (CYANOCOBALAMIN) 1000 MCG  tablet Take 1,000 mcg by mouth daily.     No current facility-administered medications on file prior to visit.    Allergies  Allergen Reactions  . Codeine Nausea And Vomiting  . Sulfur     headache   No family history on file.  PE: BP (!) 144/86   Pulse 77   Ht 5' 1.5" (1.562 m)   Wt 186 lb 9.6 oz (84.6 kg)   SpO2 98%   BMI 34.69 kg/m  Wt Readings from Last 3 Encounters:  05/21/17 186 lb 9.6 oz (84.6 kg)  11/23/16 178 lb 14.4 oz (81.1 kg)  10/02/16 180 lb 6.4 oz (81.8 kg)   Constitutional: overweight, in NAD Eyes: PERRLA, EOMI, no exophthalmos ENT: moist mucous membranes,  no thyromegaly, no cervical lymphadenopathy Cardiovascular: RRR, No MRG Respiratory: CTA B Gastrointestinal: abdomen soft, NT, ND, BS+ Musculoskeletal: no deformities, strength intact in all 4 Skin: moist, warm, no rashes Neurological: no tremor with outstretched hands, DTR normal in all 4  ASSESSMENT: 1. Hypothyroidism  2. Osteopenia  PLAN:  1. Patient with long-standing acquired hypothyroidism, on levothyroxine therapy, with normalized TFTs after increasing levothyroxine to 112 mcg daily. - latest thyroid labs reviewed with pt >> normal a year ago - she continues on LT4 112 mcg daily - pt feels good on this dose. - we discussed about taking the thyroid hormone every day, with water, >30 minutes before breakfast, separated by >4 hours from acid reflux medications, calcium, iron, multivitamins. Pt. is taking it correctly. - will check thyroid tests today: TSH and fT4 - If labs are abnormal, she will need to return for repeat TFTs in 1.5 months  2. Osteopenia - Reviewed the report of her latest DEXA scan and compared it to the one from 2016: Scores are better. - She refused to start on bone building medications - Discussed about the need to start weightbearing exercises -given examples  Needs 3 mo supplies to [facility].   Component     Latest Ref Rng & Units 05/21/2017  T4,Free(Direct)     0.60 - 1.60 ng/dL 1.30  TSH     0.35 - 4.50 uIU/mL 1.90  Normal TFTs.  We will refill her current levothyroxine dose.  Philemon Kingdom, MD PhD [facility]

## 2017-05-21 NOTE — Patient Instructions (Signed)
Please stop at the lab.  Please continue Levothyroxine 112 mcg daily.  Take the thyroid hormone every day, with water, at least 30 minutes before breakfast, separated by at least 4 hours from: - acid reflux medications - calcium - iron - multivitamins  Please come back for a follow-up appointment in 1 year. 

## 2017-05-21 NOTE — Telephone Encounter (Signed)
-----   Message from Philemon Kingdom, MD sent at 05/21/2017  2:21 PM EDT ----- Larey Seat, can you please call pt: Her thyroid tests are normal, so I refilled the same dose of levothyroxine to [facility].

## 2017-05-21 NOTE — Telephone Encounter (Signed)
Called pt. No answer °

## 2017-05-22 NOTE — Telephone Encounter (Signed)
Called pt. No answer. Left vmail per DPR.

## 2017-07-08 DIAGNOSIS — G4721 Circadian rhythm sleep disorder, delayed sleep phase type: Secondary | ICD-10-CM | POA: Diagnosis not present

## 2017-07-08 DIAGNOSIS — G4733 Obstructive sleep apnea (adult) (pediatric): Secondary | ICD-10-CM | POA: Diagnosis not present

## 2017-07-18 ENCOUNTER — Telehealth: Payer: Self-pay | Admitting: Internal Medicine

## 2017-07-18 DIAGNOSIS — E038 Other specified hypothyroidism: Secondary | ICD-10-CM | POA: Diagnosis not present

## 2017-07-18 DIAGNOSIS — M5431 Sciatica, right side: Secondary | ICD-10-CM | POA: Diagnosis not present

## 2017-07-18 DIAGNOSIS — E782 Mixed hyperlipidemia: Secondary | ICD-10-CM | POA: Diagnosis not present

## 2017-07-18 DIAGNOSIS — M545 Low back pain: Secondary | ICD-10-CM | POA: Diagnosis not present

## 2017-07-18 DIAGNOSIS — I10 Essential (primary) hypertension: Secondary | ICD-10-CM | POA: Diagnosis not present

## 2017-07-18 DIAGNOSIS — Z6831 Body mass index (BMI) 31.0-31.9, adult: Secondary | ICD-10-CM | POA: Diagnosis not present

## 2017-07-18 DIAGNOSIS — J301 Allergic rhinitis due to pollen: Secondary | ICD-10-CM | POA: Diagnosis not present

## 2017-07-18 NOTE — Telephone Encounter (Signed)
Faxed to PCP

## 2017-07-18 NOTE — Telephone Encounter (Signed)
Patient ask if you could send her last labs to her PCP Dr Sherrie Sport office Fax# [phone]

## 2017-08-01 DIAGNOSIS — M545 Low back pain: Secondary | ICD-10-CM | POA: Diagnosis not present

## 2017-08-01 DIAGNOSIS — M5136 Other intervertebral disc degeneration, lumbar region: Secondary | ICD-10-CM | POA: Diagnosis not present

## 2017-08-09 ENCOUNTER — Other Ambulatory Visit: Payer: Self-pay | Admitting: Internal Medicine

## 2017-08-09 DIAGNOSIS — M545 Low back pain: Secondary | ICD-10-CM

## 2017-08-19 ENCOUNTER — Ambulatory Visit
Admission: RE | Admit: 2017-08-19 | Discharge: 2017-08-19 | Disposition: A | Discharge status: Home / Self Care | Payer: Medicare HMO | Source: Ambulatory Visit | Attending: Internal Medicine | Admitting: Internal Medicine

## 2017-08-19 DIAGNOSIS — M5126 Other intervertebral disc displacement, lumbar region: Secondary | ICD-10-CM | POA: Diagnosis not present

## 2017-08-19 DIAGNOSIS — M545 Low back pain: Secondary | ICD-10-CM

## 2017-08-19 MED ORDER — IOPAMIDOL (ISOVUE-M 200) INJECTION 41%
1.0000 mL | Freq: Once | INTRAMUSCULAR | Status: AC
  Administered 2017-08-19: 1 mL via EPIDURAL

## 2017-08-19 MED ORDER — METHYLPREDNISOLONE ACETATE 40 MG/ML INJ SUSP (RADIOLOG
120.0000 mg | Freq: Once | INTRAMUSCULAR | Status: AC
  Administered 2017-08-19: 120 mg via EPIDURAL

## 2017-08-19 NOTE — Discharge Instructions (Signed)

## 2017-08-21 ENCOUNTER — Telehealth: Payer: Self-pay

## 2017-08-21 NOTE — Telephone Encounter (Signed)
Patient called to report a "squeezing "headache from her right  temple to the top of her head starting yesterday (08/20/17) after she had a lumbar ESI here 08/19/17.  She also says she's noticed her appetite has increased and she's having strange dreams.  She has taken two Aleve twice with good results.  I told her the symptoms she is describing probably are from the steroid and that she seems extra sensitive to it given in this form.  She also said she has noticed some popping in her ears and wonders if her sinuses are bothering her.  She stated she would continue to take Aleve and perhaps a decongestant.

## 2017-10-28 DIAGNOSIS — G4721 Circadian rhythm sleep disorder, delayed sleep phase type: Secondary | ICD-10-CM | POA: Diagnosis not present

## 2017-10-28 DIAGNOSIS — G4733 Obstructive sleep apnea (adult) (pediatric): Secondary | ICD-10-CM | POA: Diagnosis not present

## 2017-11-07 DIAGNOSIS — M47816 Spondylosis without myelopathy or radiculopathy, lumbar region: Secondary | ICD-10-CM | POA: Diagnosis not present

## 2017-11-11 DIAGNOSIS — M545 Low back pain: Secondary | ICD-10-CM | POA: Diagnosis not present

## 2017-11-11 DIAGNOSIS — R262 Difficulty in walking, not elsewhere classified: Secondary | ICD-10-CM | POA: Diagnosis not present

## 2017-11-11 DIAGNOSIS — M47816 Spondylosis without myelopathy or radiculopathy, lumbar region: Secondary | ICD-10-CM | POA: Diagnosis not present

## 2017-11-14 DIAGNOSIS — M545 Low back pain: Secondary | ICD-10-CM | POA: Diagnosis not present

## 2017-11-14 DIAGNOSIS — M47816 Spondylosis without myelopathy or radiculopathy, lumbar region: Secondary | ICD-10-CM | POA: Diagnosis not present

## 2017-11-14 DIAGNOSIS — R262 Difficulty in walking, not elsewhere classified: Secondary | ICD-10-CM | POA: Diagnosis not present

## 2017-11-18 DIAGNOSIS — R262 Difficulty in walking, not elsewhere classified: Secondary | ICD-10-CM | POA: Diagnosis not present

## 2017-11-18 DIAGNOSIS — E038 Other specified hypothyroidism: Secondary | ICD-10-CM | POA: Diagnosis not present

## 2017-11-18 DIAGNOSIS — M545 Low back pain: Secondary | ICD-10-CM | POA: Diagnosis not present

## 2017-11-18 DIAGNOSIS — I10 Essential (primary) hypertension: Secondary | ICD-10-CM | POA: Diagnosis not present

## 2017-11-18 DIAGNOSIS — Z0000 Encounter for general adult medical examination without abnormal findings: Secondary | ICD-10-CM | POA: Diagnosis not present

## 2017-11-18 DIAGNOSIS — Z6835 Body mass index (BMI) 35.0-35.9, adult: Secondary | ICD-10-CM | POA: Diagnosis not present

## 2017-11-18 DIAGNOSIS — M5431 Sciatica, right side: Secondary | ICD-10-CM | POA: Diagnosis not present

## 2017-11-18 DIAGNOSIS — Z1389 Encounter for screening for other disorder: Secondary | ICD-10-CM | POA: Diagnosis not present

## 2017-11-18 DIAGNOSIS — Z6831 Body mass index (BMI) 31.0-31.9, adult: Secondary | ICD-10-CM | POA: Diagnosis not present

## 2017-11-18 DIAGNOSIS — J301 Allergic rhinitis due to pollen: Secondary | ICD-10-CM | POA: Diagnosis not present

## 2017-11-18 DIAGNOSIS — M47816 Spondylosis without myelopathy or radiculopathy, lumbar region: Secondary | ICD-10-CM | POA: Diagnosis not present

## 2017-11-21 DIAGNOSIS — R262 Difficulty in walking, not elsewhere classified: Secondary | ICD-10-CM | POA: Diagnosis not present

## 2017-11-21 DIAGNOSIS — M545 Low back pain: Secondary | ICD-10-CM | POA: Diagnosis not present

## 2017-11-21 DIAGNOSIS — M47816 Spondylosis without myelopathy or radiculopathy, lumbar region: Secondary | ICD-10-CM | POA: Diagnosis not present

## 2017-11-25 DIAGNOSIS — M47816 Spondylosis without myelopathy or radiculopathy, lumbar region: Secondary | ICD-10-CM | POA: Diagnosis not present

## 2017-11-25 DIAGNOSIS — R262 Difficulty in walking, not elsewhere classified: Secondary | ICD-10-CM | POA: Diagnosis not present

## 2017-11-25 DIAGNOSIS — M545 Low back pain: Secondary | ICD-10-CM | POA: Diagnosis not present

## 2017-11-28 DIAGNOSIS — R262 Difficulty in walking, not elsewhere classified: Secondary | ICD-10-CM | POA: Diagnosis not present

## 2017-11-28 DIAGNOSIS — M545 Low back pain: Secondary | ICD-10-CM | POA: Diagnosis not present

## 2017-11-28 DIAGNOSIS — M47816 Spondylosis without myelopathy or radiculopathy, lumbar region: Secondary | ICD-10-CM | POA: Diagnosis not present

## 2017-12-03 DIAGNOSIS — M545 Low back pain: Secondary | ICD-10-CM | POA: Diagnosis not present

## 2017-12-03 DIAGNOSIS — R262 Difficulty in walking, not elsewhere classified: Secondary | ICD-10-CM | POA: Diagnosis not present

## 2017-12-03 DIAGNOSIS — M47816 Spondylosis without myelopathy or radiculopathy, lumbar region: Secondary | ICD-10-CM | POA: Diagnosis not present

## 2017-12-06 DIAGNOSIS — R262 Difficulty in walking, not elsewhere classified: Secondary | ICD-10-CM | POA: Diagnosis not present

## 2017-12-06 DIAGNOSIS — M545 Low back pain: Secondary | ICD-10-CM | POA: Diagnosis not present

## 2017-12-06 DIAGNOSIS — M47816 Spondylosis without myelopathy or radiculopathy, lumbar region: Secondary | ICD-10-CM | POA: Diagnosis not present

## 2017-12-09 DIAGNOSIS — M545 Low back pain: Secondary | ICD-10-CM | POA: Diagnosis not present

## 2017-12-09 DIAGNOSIS — M47816 Spondylosis without myelopathy or radiculopathy, lumbar region: Secondary | ICD-10-CM | POA: Diagnosis not present

## 2017-12-09 DIAGNOSIS — R262 Difficulty in walking, not elsewhere classified: Secondary | ICD-10-CM | POA: Diagnosis not present

## 2017-12-12 DIAGNOSIS — R262 Difficulty in walking, not elsewhere classified: Secondary | ICD-10-CM | POA: Diagnosis not present

## 2017-12-12 DIAGNOSIS — M47816 Spondylosis without myelopathy or radiculopathy, lumbar region: Secondary | ICD-10-CM | POA: Diagnosis not present

## 2017-12-12 DIAGNOSIS — M545 Low back pain: Secondary | ICD-10-CM | POA: Diagnosis not present

## 2017-12-16 DIAGNOSIS — M545 Low back pain: Secondary | ICD-10-CM | POA: Diagnosis not present

## 2017-12-16 DIAGNOSIS — R262 Difficulty in walking, not elsewhere classified: Secondary | ICD-10-CM | POA: Diagnosis not present

## 2017-12-16 DIAGNOSIS — M47816 Spondylosis without myelopathy or radiculopathy, lumbar region: Secondary | ICD-10-CM | POA: Diagnosis not present

## 2017-12-19 DIAGNOSIS — R262 Difficulty in walking, not elsewhere classified: Secondary | ICD-10-CM | POA: Diagnosis not present

## 2017-12-19 DIAGNOSIS — M545 Low back pain: Secondary | ICD-10-CM | POA: Diagnosis not present

## 2017-12-19 DIAGNOSIS — M47816 Spondylosis without myelopathy or radiculopathy, lumbar region: Secondary | ICD-10-CM | POA: Diagnosis not present

## 2017-12-20 DIAGNOSIS — M810 Age-related osteoporosis without current pathological fracture: Secondary | ICD-10-CM | POA: Diagnosis not present

## 2017-12-20 DIAGNOSIS — M4696 Unspecified inflammatory spondylopathy, lumbar region: Secondary | ICD-10-CM | POA: Diagnosis not present

## 2017-12-20 DIAGNOSIS — M47816 Spondylosis without myelopathy or radiculopathy, lumbar region: Secondary | ICD-10-CM | POA: Diagnosis not present

## 2018-01-22 DIAGNOSIS — L570 Actinic keratosis: Secondary | ICD-10-CM | POA: Diagnosis not present

## 2018-02-03 DIAGNOSIS — M47816 Spondylosis without myelopathy or radiculopathy, lumbar region: Secondary | ICD-10-CM | POA: Diagnosis not present

## 2018-03-17 ENCOUNTER — Other Ambulatory Visit: Payer: Self-pay | Admitting: Nurse Practitioner

## 2018-03-17 DIAGNOSIS — M47816 Spondylosis without myelopathy or radiculopathy, lumbar region: Secondary | ICD-10-CM | POA: Diagnosis not present

## 2018-03-17 DIAGNOSIS — M4696 Unspecified inflammatory spondylopathy, lumbar region: Secondary | ICD-10-CM | POA: Diagnosis not present

## 2018-03-18 DIAGNOSIS — Z0000 Encounter for general adult medical examination without abnormal findings: Secondary | ICD-10-CM | POA: Diagnosis not present

## 2018-03-18 DIAGNOSIS — Z6835 Body mass index (BMI) 35.0-35.9, adult: Secondary | ICD-10-CM | POA: Diagnosis not present

## 2018-03-24 ENCOUNTER — Ambulatory Visit
Admission: RE | Admit: 2018-03-24 | Discharge: 2018-03-24 | Disposition: A | Discharge status: Home / Self Care | Payer: Medicare HMO | Source: Ambulatory Visit | Attending: Nurse Practitioner | Admitting: Nurse Practitioner

## 2018-03-24 DIAGNOSIS — M545 Low back pain: Secondary | ICD-10-CM | POA: Diagnosis not present

## 2018-03-24 DIAGNOSIS — M47816 Spondylosis without myelopathy or radiculopathy, lumbar region: Secondary | ICD-10-CM

## 2018-03-24 MED ORDER — IOPAMIDOL (ISOVUE-M 200) INJECTION 41%
1.0000 mL | Freq: Once | INTRAMUSCULAR | Status: AC
  Administered 2018-03-24: 1 mL via INTRA_ARTICULAR

## 2018-03-24 MED ORDER — METHYLPREDNISOLONE ACETATE 40 MG/ML INJ SUSP (RADIOLOG
120.0000 mg | Freq: Once | INTRAMUSCULAR | Status: AC
  Administered 2018-03-24: 120 mg via INTRA_ARTICULAR

## 2018-03-24 NOTE — Discharge Instructions (Signed)

## 2018-03-27 DIAGNOSIS — Z1231 Encounter for screening mammogram for malignant neoplasm of breast: Secondary | ICD-10-CM | POA: Diagnosis not present

## 2018-04-03 ENCOUNTER — Encounter (INDEPENDENT_AMBULATORY_CARE_PROVIDER_SITE_OTHER): Payer: Self-pay | Admitting: Internal Medicine

## 2018-04-03 ENCOUNTER — Telehealth (INDEPENDENT_AMBULATORY_CARE_PROVIDER_SITE_OTHER): Payer: Self-pay | Admitting: *Deleted

## 2018-04-03 ENCOUNTER — Ambulatory Visit (INDEPENDENT_AMBULATORY_CARE_PROVIDER_SITE_OTHER): Payer: Medicare HMO | Admitting: Internal Medicine

## 2018-04-03 VITALS — BP 132/74 | HR 80 | Temp 98.0°F | Ht 62.0 in | Wt 194.5 lb

## 2018-04-03 DIAGNOSIS — K219 Gastro-esophageal reflux disease without esophagitis: Secondary | ICD-10-CM

## 2018-04-03 DIAGNOSIS — R1011 Right upper quadrant pain: Secondary | ICD-10-CM

## 2018-04-03 DIAGNOSIS — N390 Urinary tract infection, site not specified: Secondary | ICD-10-CM | POA: Diagnosis not present

## 2018-04-03 MED ORDER — PANTOPRAZOLE SODIUM 40 MG PO TBEC: 40.0000 mg | DELAYED_RELEASE_TABLET | Freq: Every day | ORAL | 5 refills | Status: AC

## 2018-04-03 NOTE — Progress Notes (Signed)
   Subjective:    Patient ID: Karen Schultz, female    DOB: [birth year], 73 y.o.   MRN: [id number]  HPI Referred by Dr. Sherrie Sport for GERD. Symptoms for the last few years. She tells me if she drinks too much soda, it burns her esophagus. If she drinks red wine, she has burning. Sometimes she can drink water and her esophagus burns.  She is trying to stay away from carbonated drinks.She can eat spicy foods without problems. Peppers make her burp.  She ate at [employer] one day and felt very uncomfortable.  Her appetite is good. She has lost about  5 1/2 pounds which was intentional. Sometimes she has RUQ pain which his random.    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 Hypertension   . Lower extremity edema 09/26/2015  . OSA on CPAP    5-6 years (as of 09/2014)  . Thyroid disease     History reviewed. No pertinent surgical history.  Allergies  Allergen Reactions  . Codeine Nausea And Vomiting  . Naproxen Nausea Only    Stomach irritation.  . Sulfur Other (See Comments)    headache    Current Outpatient Medications on File Prior to Visit  Medication Sig Dispense Refill  . benazepril-hydrochlorthiazide (LOTENSIN HCT) 20-25 MG per tablet Take 1 tablet by mouth daily.    . Calcium Carbonate-Vit D-Min (CALTRATE 600+D PLUS PO) Take by mouth.    . fluticasone (FLONASE) 50 MCG/ACT nasal spray Place into both nostrils as needed for allergies or rhinitis.    Marland Kitchen levothyroxine (SYNTHROID, LEVOTHROID) 112 MCG tablet TAKE 1 TABLET EVERY DAY BEFORE BREAKFAST 90 tablet 3  . magnesium 30 MG tablet Take 250 mg by mouth daily.    . NON FORMULARY at bedtime. CPAP    . Probiotic Product (PROBIOTIC DAILY PO) Take by mouth.    . RA KRILL OIL PO Take 1 capsule by mouth daily.    . sucralfate (CARAFATE) 1 g tablet Take 1 g by mouth. 2-3 times a day    . vitamin B-12 (CYANOCOBALAMIN) 1000 MCG tablet Take 1,000 mcg by mouth daily.     No  current facility-administered medications on file prior to visit.         Objective:   Physical Exam Blood pressure 132/74, pulse 80, temperature 98 F (36.7 C), height 5\' 2"  (1.575 m), weight 194 lb 8 oz (88.2 kg). Alert and oriented. Skin warm and dry. Oral mucosa is moist.   . Sclera anicteric, conjunctivae is pink. Thyroid not enlarged. No cervical lymphadenopathy. Lungs clear. Heart regular rate and rhythm.  Abdomen is soft. Bowel sounds are positive. No hepatomegaly. No abdominal masses felt. No tenderness.  No edema to lower extremities.          Assessment & Plan:  GERD. Am going to start her on Protonix po daily. RUQ pain Am going to get a HIDA scan.  Further recommendation to follow. Stop the Carafate.

## 2018-04-03 NOTE — Telephone Encounter (Signed)
Discussed.

## 2018-04-03 NOTE — Telephone Encounter (Signed)
Patient wants to know how to take probiotic -- please advise

## 2018-04-03 NOTE — Patient Instructions (Addendum)

## 2018-04-10 ENCOUNTER — Encounter (HOSPITAL_COMMUNITY): Payer: Self-pay

## 2018-04-10 ENCOUNTER — Encounter (HOSPITAL_COMMUNITY)
Admission: RE | Admit: 2018-04-10 | Discharge: 2018-04-10 | Disposition: A | Discharge status: Home / Self Care | Payer: Medicare HMO | Source: Ambulatory Visit | Attending: Internal Medicine | Admitting: Internal Medicine

## 2018-04-10 DIAGNOSIS — R1011 Right upper quadrant pain: Secondary | ICD-10-CM | POA: Insufficient documentation

## 2018-04-10 MED ORDER — TECHNETIUM TC 99M MEBROFENIN IV KIT
5.0000 | PACK | Freq: Once | INTRAVENOUS | Status: AC | PRN
  Administered 2018-04-10: 5.3 via INTRAVENOUS

## 2018-04-15 ENCOUNTER — Telehealth (INDEPENDENT_AMBULATORY_CARE_PROVIDER_SITE_OTHER): Payer: Self-pay | Admitting: Internal Medicine

## 2018-04-15 DIAGNOSIS — M810 Age-related osteoporosis without current pathological fracture: Secondary | ICD-10-CM | POA: Diagnosis not present

## 2018-04-15 DIAGNOSIS — M47816 Spondylosis without myelopathy or radiculopathy, lumbar region: Secondary | ICD-10-CM | POA: Diagnosis not present

## 2018-04-15 NOTE — Telephone Encounter (Signed)
Patient called regarding test results  -  Ph# [phone]

## 2018-04-16 DIAGNOSIS — N390 Urinary tract infection, site not specified: Secondary | ICD-10-CM | POA: Diagnosis not present

## 2018-04-17 NOTE — Telephone Encounter (Signed)
I have called her multiple times. Results have been left on her phone. I will keep trying to reach her.

## 2018-05-07 ENCOUNTER — Telehealth (INDEPENDENT_AMBULATORY_CARE_PROVIDER_SITE_OTHER): Payer: Self-pay | Admitting: Internal Medicine

## 2018-05-07 NOTE — Telephone Encounter (Signed)
OV in 4 weeks.  

## 2018-05-22 ENCOUNTER — Ambulatory Visit: Payer: Medicare HMO | Admitting: Internal Medicine

## 2018-06-05 ENCOUNTER — Ambulatory Visit (INDEPENDENT_AMBULATORY_CARE_PROVIDER_SITE_OTHER): Payer: Medicare HMO | Admitting: Internal Medicine

## 2018-06-23 ENCOUNTER — Other Ambulatory Visit: Payer: Self-pay | Admitting: Internal Medicine

## 2018-06-24 ENCOUNTER — Other Ambulatory Visit: Payer: Self-pay

## 2018-06-24 ENCOUNTER — Encounter (INDEPENDENT_AMBULATORY_CARE_PROVIDER_SITE_OTHER): Payer: Self-pay | Admitting: Internal Medicine

## 2018-06-24 ENCOUNTER — Ambulatory Visit (INDEPENDENT_AMBULATORY_CARE_PROVIDER_SITE_OTHER): Payer: Medicare HMO | Admitting: Internal Medicine

## 2018-06-24 VITALS — BP 128/67 | HR 97 | Temp 98.2°F | Ht 62.0 in | Wt 199.3 lb

## 2018-06-24 DIAGNOSIS — K219 Gastro-esophageal reflux disease without esophagitis: Secondary | ICD-10-CM | POA: Insufficient documentation

## 2018-06-24 NOTE — Patient Instructions (Signed)
GERD. Start the Protonix daily. OV in 6 months.

## 2018-06-24 NOTE — Progress Notes (Signed)
   Subjective:    Patient ID: Karen Schultz, female    DOB: 1945/04/26, 73 y.o.   MRN: [id number]  HPI Here today for f/u. She was seen in February of this year asa new patient for GERD. She had been referred by Dr. Sherrie Sport. She gave a hx of GERD. If she drank too much soda or wine it burned her esophagus.  She sometimes could even drink water and it would burn. She had been trying to avoie carbonated drinks. She admits she can eat spicy foods without any problems.  She was started on Protonix po daily. Has been off the Protonix for about 2 months.  I ordered a HIDA scan which was normal.  Has been trying to go by the GERD Diet I given her. She has been drinking Pepsi but spreads it out over 2 days. Her appetite is good. She has gained 5 pounds since her visit. BMs are moving okay.     Review of Systems Past Medical History:  Diagnosis Date  . Hypertension   . Lower extremity edema 09/26/2015  . OSA on CPAP    5-6 years (as of 09/2014)  . Thyroid disease     Past Surgical History:  Procedure Laterality Date  . CESAREAN SECTION    . laproscopy    . TUBAL LIGATION      Allergies  Allergen Reactions  . Codeine Nausea And Vomiting  . Naproxen Nausea Only    Stomach irritation.  . Sulfur Other (See Comments)    headache    Current Outpatient Medications on File Prior to Visit  Medication Sig Dispense Refill  . benazepril-hydrochlorthiazide (LOTENSIN HCT) 20-25 MG per tablet Take 1 tablet by mouth daily.    . Calcium Carbonate-Vit D-Min (CALTRATE 600+D PLUS PO) Take by mouth.    . fluticasone (FLONASE) 50 MCG/ACT nasal spray Place into both nostrils as needed for allergies or rhinitis.    Marland Kitchen levothyroxine (SYNTHROID) 112 MCG tablet TAKE 1 TABLET EVERY DAY BEFORE BREAKFAST 90 tablet 3  . magnesium 30 MG tablet Take 250 mg by mouth daily.    . NON FORMULARY at bedtime. CPAP    . pantoprazole (PROTONIX) 40 MG tablet Take 1 tablet (40 mg total) by mouth daily. 30 tablet 5  .  Probiotic Product (PROBIOTIC DAILY PO) Take by mouth.    . RA KRILL OIL PO Take 1 capsule by mouth daily.    . vitamin B-12 (CYANOCOBALAMIN) 1000 MCG tablet Take 1,000 mcg by mouth daily.     No current facility-administered medications on file prior to visit.         Objective:   Physical Exam Blood pressure 128/67, pulse 97, temperature 98.2 F (36.8 C), height 5\' 2"  (1.575 m), weight 199 lb 4.8 oz (90.4 kg). Alert and oriented. Skin warm and dry. Oral mucosa is moist.   . Sclera anicteric, conjunctivae is pink. Thyroid not enlarged. No cervical lymphadenopathy. Lungs clear. Heart regular rate and rhythm.  Abdomen is soft. Bowel sounds are positive. No hepatomegaly. No abdominal masses felt. No tenderness.  No edema to lower extremities.           Assessment & Plan:  GERD. She will continue the Protonix. OV in 6 months.

## 2018-07-01 ENCOUNTER — Telehealth: Payer: Self-pay | Admitting: Cardiovascular Disease

## 2018-07-01 NOTE — Telephone Encounter (Signed)
home phone/ declined my chart/ consent/ pre reg completed  °

## 2018-07-03 ENCOUNTER — Telehealth (INDEPENDENT_AMBULATORY_CARE_PROVIDER_SITE_OTHER): Payer: Medicare HMO | Admitting: Cardiovascular Disease

## 2018-07-03 DIAGNOSIS — E669 Obesity, unspecified: Secondary | ICD-10-CM | POA: Diagnosis not present

## 2018-07-03 DIAGNOSIS — Z9989 Dependence on other enabling machines and devices: Secondary | ICD-10-CM

## 2018-07-03 DIAGNOSIS — R072 Precordial pain: Secondary | ICD-10-CM | POA: Diagnosis not present

## 2018-07-03 DIAGNOSIS — G4733 Obstructive sleep apnea (adult) (pediatric): Secondary | ICD-10-CM

## 2018-07-03 DIAGNOSIS — I10 Essential (primary) hypertension: Secondary | ICD-10-CM | POA: Diagnosis not present

## 2018-07-03 NOTE — Progress Notes (Signed)
Virtual Visit via Telephone Note   This visit type was conducted due to national recommendations for restrictions regarding the COVID-19 Pandemic (e.g. social distancing) in an effort to limit this patient's exposure and mitigate transmission in our community.  Due to her co-morbid illnesses, this patient is at least at moderate risk for complications without adequate follow up.  This format is felt to be most appropriate for this patient at this time.  The patient did not have access to video technology/had technical difficulties with video requiring transitioning to audio format only (telephone).  All issues noted in this document were discussed and addressed.  No physical exam could be performed with this format.  Please refer to the patient's chart for her  consent to telehealth for [facility].   Date:  07/03/2018   ID:  Karen Schultz, DOB Nov 14, 1945, MRN [id number]  Patient Location: Home Provider Location: Home  PCP:  Neale Burly, MD  Cardiologist:  Skeet Latch, MD  Electrophysiologist:  None   Evaluation Performed:  Follow-Up Visit  Chief Complaint:  Follow up  History of Present Illness:    Karen Schultz is a 73 y.o. female OSA and HTN who presents for follow up.  Karen Schultz was first seen 09/2014 with lower extremity edema and atypical chest pain.  Her edema has been occurring intermittently for 3 years and was felt to be due to venous insufficiency.  BNP was 35.  She was referred for an echo 10/07/15 that revealed LVEF 60-65% and grade 1 diastolic dysfunction. There were no significant valvular abnormalities She also reported aching in the right side of her chest when walking up an incline and was referred for ETT 09/24/14 that was negative for ischemia.  She achieved 7 METS on the Bruce protocol.  She reported claudication and underwent ABIs 10/06/15 that were negative bilaterally.  She continues to have lower extremity edema that is worse When sitting or standing for  prolonged periods of time. She has no orthopnea or PND. She hasn't been exercising quite as much lately because she is been traveling back and forth to [place] to visit her daughter who recently had surgery. She has no exertional symptoms. Karen Schultz is been working with the nutritionist and has lost over 20 pounds in the last year.  She reports that her cholesterol was recently checked with her PCP.  Since her last appointment Karen Schultz has been doing well.  She tries to walk at least once per day when the weather is nice.  She generally has no exertional chest pain or shortness of breath.  She notes that if she doesn't walk for a few days then the walk is more difficult on the inclines. She sometimes feels heaviness in her chest and short of breath going up inclines.  There is no associated nausea or diaphoresis.  It occurs more commonly as she gains weight.  After she exercises more regularly it seems less likely to occur.  She has struggled with her weight which she attributes to eating too much carbs.  She also has chronic back pain which limits her ability to exercise. She saw an ortho PA who recommended that she take Alleve bid.  However she had to stop this due to GI upset.  Her LE edema has been controlled with compression stockings.  The patient does not have symptoms concerning for COVID-19 infection (fever, chills, cough, or new shortness of breath).    Past Medical History:  Diagnosis Date   Hypertension  Lower extremity edema 09/26/2015   OSA on CPAP    5-6 years (as of 09/2014)   Thyroid disease    Past Surgical History:  Procedure Laterality Date   CESAREAN SECTION     laproscopy     TUBAL LIGATION       Current Meds  Medication Sig   benazepril-hydrochlorthiazide (LOTENSIN HCT) 20-25 MG per tablet Take 1 tablet by mouth daily.   Calcium Carbonate-Vit D-Min (CALTRATE 600+D PLUS PO) Take by mouth.   levothyroxine (SYNTHROID) 112 MCG tablet TAKE 1 TABLET EVERY  DAY BEFORE BREAKFAST   magnesium 30 MG tablet Take 250 mg by mouth daily.   NON FORMULARY at bedtime. CPAP   pantoprazole (PROTONIX) 40 MG tablet Take 1 tablet (40 mg total) by mouth daily.   RA KRILL OIL PO Take 1 capsule by mouth daily.   vitamin B-12 (CYANOCOBALAMIN) 1000 MCG tablet Take 1,000 mcg by mouth daily.     Allergies:   Codeine; Naproxen; and Sulfur   Social History   Tobacco Use   Smoking status: Never Smoker   Smokeless tobacco: Never Used  Substance Use Topics   Alcohol use: No    Alcohol/week: 0.0 standard drinks    Comment: socially   Drug use: No     Family Hx: The patient's family history is not on file.  ROS:   Please see the history of present illness.    All other systems reviewed and are negative.   Prior CV studies:   The following studies were reviewed today:  ABIs 10/06/15: Normal bilaterally  Echo 10/07/15: Study Conclusions  - Left ventricle: The cavity size was normal. There was mild focal basal hypertrophy of the septum. Systolic function was normal. The estimated ejection fraction was in the range of 60% to 65%. Wall motion was normal; there were no regional wall motion abnormalities. Doppler parameters are consistent with abnormal left ventricular relaxation (grade 1 diastolic dysfunction). The E/e&' ratio is between 8-15, suggesting indeterminate LV filling pressure. - Aortic valve: Trileaflet. Sclerosis without stenosis. There was trivial regurgitation. - Mitral valve: Calcified annulus. Mildly thickened leaflets . There was trivial regurgitation. - Left atrium: The atrium was normal in size. - Right ventricle: The cavity size was normal. Wall thickness was normal. Systolic function was low normal. - Right atrium: The atrium was normal in size. - Tricuspid valve: There was trivial regurgitation. - Pulmonary arteries: PA peak pressure: 23 mm Hg (S). - Inferior vena cava: The vessel was normal in  size. The respirophasic diameter changes were in the normal range (>= 50%), consistent with normal central venous pressure.   Labs/Other Tests and Data Reviewed:    EKG:  No ECG reviewed.  Recent Labs: No results found for requested labs within last 8760 hours.   11/18/17: Total cholesterol 148, triglycerides 100, HDL 53, LDL 75 Hemoglobin A1c 5.7%   Recent Lipid Panel No results found for: CHOL, TRIG, HDL, CHOLHDL, LDLCALC, LDLDIRECT  Wt Readings from Last 3 Encounters:  07/03/18 198 lb 3.2 oz (89.9 kg)  06/24/18 199 lb 4.8 oz (90.4 kg)  04/03/18 194 lb 8 oz (88.2 kg)     Objective:    Ht 5\' 2"  (1.575 m)    Wt 198 lb 3.2 oz (89.9 kg)    BMI 36.25 kg/m  GENERAL: Sounds well. No acute distress.  No respiratory distress. NEURO:  Speech fluent.  PSYCH:  Cognitively intact, oriented to person place and time   ASSESSMENT & PLAN:    #  Hypertension: BP well-controlled at other visits.  She is unable to check at home.  Continue benazepril/HCTZ.  She wants to stop her BP medication but given that her BP has been in the 120s, this is not a good idea at this time.  We discussed the importance of exercise and weight loss.  # Atypical chest pain:  Symptoms are atypical and only occur when she is deconditioned or with increased weight.  At this time she wants to wait on further stress testing.  ETT was negative for ischemia 09/2014.  # LE edema:  # Exertional dyspnea:  Due to venous insufficiency.  Continue compression stockings and HCTZ.   # Leg pain: ABIs were negative.  This is not due to PAD.  Continue follow up for her back.  COVID-19 Education: The signs and symptoms of COVID-19 were discussed with the patient and how to seek care for testing (follow up with PCP or arrange E-visit).  The importance of social distancing was discussed today.  Time:   Today, I have spent 25 minutes with the patient with telehealth technology discussing the above problems.      Medication Adjustments/Labs and Tests Ordered: Current medicines are reviewed at length with the patient today.  Concerns regarding medicines are outlined above.   Tests Ordered: No orders of the defined types were placed in this encounter.   Medication Changes: No orders of the defined types were placed in this encounter.   Disposition:  Follow up prn  Signed, Skeet Latch, MD  07/03/2018 12:23 PM    [place]

## 2018-07-03 NOTE — Patient Instructions (Signed)
Medication Instructions:  Your physician recommends that you continue on your current medications as directed. Please refer to the Current Medication list given to you today.  If you need a refill on your cardiac medications before your next appointment, please call your pharmacy.   Lab work: NONE  Testing/Procedures: NONE  Follow-Up: AS NEEDED   Any Other Special Instructions Will Be Listed Below (If Applicable). CONTINUE TO WORK ON DIET AND EXERCISE

## 2018-07-20 IMAGING — XA Imaging study
1 series · 1 of 1 positions shown · non-contrast
Comparison: none

CLINICAL DATA: Low back pain. Unspecified back pain laterality.
Unspecified chronicity. Displacement of the L3-4 lumbar disc.

[Series 1: ortho standard · 1 of 1 slices shown]
[im 1/1]
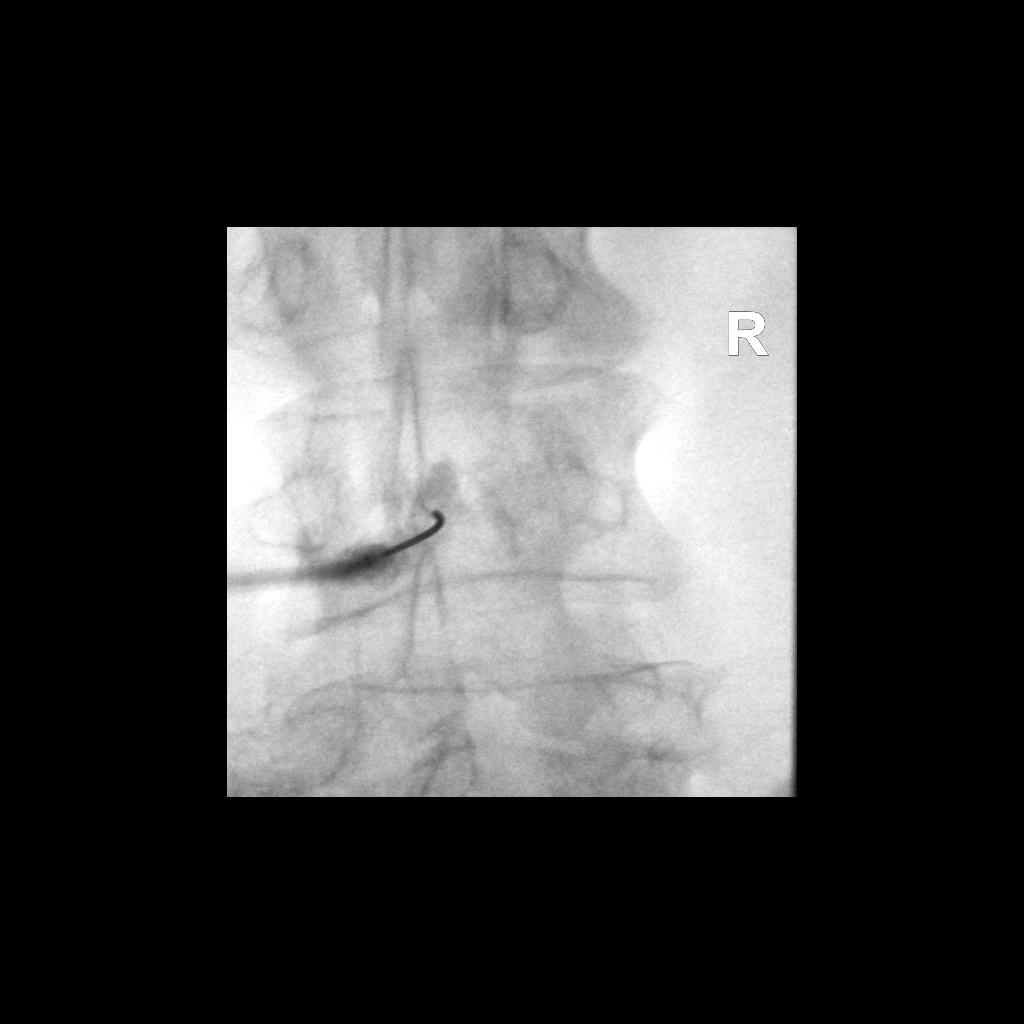

[1 of 1 positions shown; findings below may reference images not displayed]

FLUOROSCOPY TIME:  Radiation Exposure Index (as provided by the
fluoroscopic device): 17.36 uGy*m2

Fluoroscopy Time:  14 seconds

Number of Acquired Images:  0

PROCEDURE:
The procedure, risks, benefits, and alternatives were explained to
the patient. Questions regarding the procedure were encouraged and
answered. The patient understands and consents to the procedure.

LUMBAR EPIDURAL INJECTION:

An interlaminar approach was performed on right at L3-4. The
overlying skin was cleansed and anesthetized. A 20 gauge epidural
needle was advanced using loss-of-resistance technique.

DIAGNOSTIC EPIDURAL INJECTION:

Injection of Isovue-M 200 shows a good epidural pattern with spread
above and below the level of needle placement, primarily on the
right no vascular opacification is seen.

THERAPEUTIC EPIDURAL INJECTION:

120 mg of Depo-Medrol mixed with 1.5 mL 1% lidocaine were instilled.
The procedure was well-tolerated, and the patient was discharged
thirty minutes following the injection in good condition.

COMPLICATIONS:
0
IMPRESSION: Technically successful epidural injection on the right L3-4 # 1

## 2018-07-30 ENCOUNTER — Telehealth (INDEPENDENT_AMBULATORY_CARE_PROVIDER_SITE_OTHER): Payer: Self-pay | Admitting: Internal Medicine

## 2018-07-30 DIAGNOSIS — N3090 Cystitis, unspecified without hematuria: Secondary | ICD-10-CM | POA: Diagnosis not present

## 2018-07-30 DIAGNOSIS — A681 Tick-borne relapsing fever: Secondary | ICD-10-CM | POA: Diagnosis not present

## 2018-07-30 DIAGNOSIS — Z6836 Body mass index (BMI) 36.0-36.9, adult: Secondary | ICD-10-CM | POA: Diagnosis not present

## 2018-07-30 NOTE — Telephone Encounter (Signed)
Please call patient regarding her medication ph# [phone]

## 2018-07-31 NOTE — Telephone Encounter (Signed)
No answer

## 2018-07-31 NOTE — Telephone Encounter (Signed)
She will continue the Protonix

## 2018-08-07 DIAGNOSIS — N390 Urinary tract infection, site not specified: Secondary | ICD-10-CM | POA: Diagnosis not present

## 2018-08-13 ENCOUNTER — Other Ambulatory Visit: Payer: Self-pay

## 2018-08-15 ENCOUNTER — Encounter: Payer: Self-pay | Admitting: Internal Medicine

## 2018-08-15 ENCOUNTER — Ambulatory Visit: Payer: Medicare HMO | Admitting: Internal Medicine

## 2018-08-15 ENCOUNTER — Other Ambulatory Visit: Payer: Self-pay

## 2018-08-15 VITALS — BP 150/80 | HR 86 | Ht 61.81 in | Wt 197.0 lb

## 2018-08-15 DIAGNOSIS — E039 Hypothyroidism, unspecified: Secondary | ICD-10-CM | POA: Diagnosis not present

## 2018-08-15 NOTE — Progress Notes (Signed)
Patient ID: Karen Schultz, female   DOB: August 29, 1945, 73 y.o.   MRN: [id number]    HPI  Karen Schultz is a 73 y.o.-year-old female, initially referred by her cardiologist, Dr. Skeet Latch, returning for f/u for acquired hypothyroidism. Last visit 1 year and 3 months ago.  In 1 mo, she will be moving to [place].  She has low back pain and also neck pain. She had Xrays >> scoliosis and DJD. She also had a BMD checked (reviewed reports) >> improved from 2016.   Reviewed history: Pt. has been dx with hypothyroidism in 1990s in [place] >> on Synthroid initially for a long time >> now on generic levothyroxine  Pt is on levothyroxine 112 mcg daily, taken: - in am - fasting - at least 30 min from b'fast - no  Fe, MVI - + added PPIs (Pantoprazole) - close to LT4 (30 min after) - + Calcium at lunch - not on Biotin  Reviewed her TFTs: Lab Results  Component Value Date   TSH 1.90 05/21/2017   TSH 3.71 05/21/2016   TSH 2.26 12/26/2015   TSH 6.55 (H) 11/17/2015   FREET4 1.30 05/21/2017   FREET4 1.45 05/21/2016   FREET4 1.41 12/26/2015   FREET4 0.96 11/17/2015   Pt denies: - feeling nodules in neck - hoarseness - dysphagia - choking - SOB with lying down  She has no FH of thyroid disorders. No FH of thyroid cancer. No h/o radiation tx to head or neck.  No herbal supplements. No Biotin use. No recent steroids use.   She also has a history of osteopenia - had a recent scan but not available for review.  No recent vitamin D levels available for review: No results found for: VD25OH   Pt. also has a history of B12 deficiency, HTN, OSA  She exercises 3-4 times a week: Hand weights, walking, yoga.  She loves crochet.  She actually developed tendinitis from this in 2018.  ROS: Constitutional: no weight gain/no weight loss, no fatigue, no subjective hyperthermia, no subjective hypothermia Eyes: no blurry vision, no xerophthalmia ENT: no sore throat, + see  HPI Cardiovascular: no CP/no SOB/no palpitations/no leg swelling Respiratory: no cough/no SOB/no wheezing Gastrointestinal: no N/no V/no D/no C/no acid reflux Musculoskeletal: + muscle aches/+ joint aches Skin: no rashes, no hair loss Neurological: no tremors/no numbness/no tingling/no dizziness  I reviewed pt's medications, allergies, PMH, social hx, family hx, and changes were documented in the history of present illness. Otherwise, unchanged from my initial visit note.   Past Medical History:  Diagnosis Date  . Hypertension   . Lower extremity edema 09/26/2015  . OSA on CPAP    5-6 years (as of 09/2014)  . Thyroid disease    Past surgical history: - C-section in 1977 and 1979 - Tubal ligation  Social History   Social History  . Marital status: Married    Spouse name: N/A  . Number of children: 1   Occupational History  . Homemaker    Social History Main Topics  . Smoking status: Never Smoker  . Smokeless tobacco: Never Used  . Alcohol use 0.0 oz/week     Comment: socially  . Drug use: No   Current Outpatient Medications on File Prior to Visit  Medication Sig Dispense Refill  . benazepril-hydrochlorthiazide (LOTENSIN HCT) 20-25 MG per tablet Take 1 tablet by mouth daily.    . Calcium Carbonate-Vit D-Min (CALTRATE 600+D PLUS PO) Take by mouth.    . levothyroxine (SYNTHROID) 112 MCG tablet  TAKE 1 TABLET EVERY DAY BEFORE BREAKFAST 90 tablet 3  . magnesium 30 MG tablet Take 250 mg by mouth daily.    . NON FORMULARY at bedtime. CPAP    . pantoprazole (PROTONIX) 40 MG tablet Take 1 tablet (40 mg total) by mouth daily. 30 tablet 5  . RA KRILL OIL PO Take 1 capsule by mouth daily.    . vitamin B-12 (CYANOCOBALAMIN) 1000 MCG tablet Take 1,000 mcg by mouth daily.     No current facility-administered medications on file prior to visit.    Allergies  Allergen Reactions  . Codeine Nausea And Vomiting  . Naproxen Nausea Only    Stomach irritation.  . Sulfur Other (See  Comments)    headache   No family history on file.  PE: BP (!) 150/80   Pulse 86   Ht 5' 1.81" (1.57 m) Comment: measured without shoes  Wt 197 lb (89.4 kg)   BMI 36.25 kg/m  Wt Readings from Last 3 Encounters:  08/15/18 197 lb (89.4 kg)  07/03/18 198 lb 3.2 oz (89.9 kg)  06/24/18 199 lb 4.8 oz (90.4 kg)   Constitutional: overweight, in NAD Eyes: PERRLA, EOMI, no exophthalmos ENT: moist mucous membranes, no thyromegaly, no cervical lymphadenopathy Cardiovascular: RRR, No MRG Respiratory: CTA B Gastrointestinal: abdomen soft, NT, ND, BS+ Musculoskeletal: no deformities, strength intact in all 4 Skin: moist, warm, no rashes Neurological: no tremor with outstretched hands, DTR normal in all 4  ASSESSMENT: 1. Hypothyroidism  2. Osteopenia  PLAN:  1. Patient with longstanding acquired hypothyroidism, on levothyroxine therapy - latest thyroid labs reviewed with pt >> normal at last visit 05/2017 - she continues on LT4 112 mcg daily - pt feels good on this dose, with no complaints. - we discussed about taking the thyroid hormone every day, with water, >30 minutes before breakfast, separated by >4 hours from acid reflux medications, calcium, iron, multivitamins. Pt. is not taking it correctly: She added Protonix approximately 30 minutes after levothyroxine.  I advised her to move it more than 4 hours after. - will check thyroid tests in 1 month, after she separates PPIs from levothyroxine by more than 4 hours: TSH and fT4 - If labs are abnormal, she will need to return for repeat TFTs in 1.5 months  2. Osteopenia -Reviewed the latest report of the DXA scan 1016: Scores are improved -She refused bone building medications -At last visit we discussed about the need to start weightbearing exercises and I gave her examples -She does get spine steroid injections and we discussed that this can exacerbate her osteopenia  Orders Placed This Encounter  Procedures  . TSH  . T4, free    - time spent with the patient: 25 minutes, of which >50% was spent in obtaining information about her symptoms, reviewing her previous labs, evaluations, and treatments, counseling her about her conditions (please see the discussed topics above), and developing a plan to further investigate and treat them; she had a number of questions which I addressed.  Philemon Kingdom, MD PhD [facility]

## 2018-08-15 NOTE — Patient Instructions (Addendum)
Please have a repeat set of labs in 1 month.  Please continue levothyroxine 112 mcg daily.  Take the thyroid hormone every day, with water, at least 30 minutes before breakfast, separated by at least 4 hours from: - acid reflux medications - calcium - iron - multivitamins  Move Protonix to lunchtime.

## 2018-08-26 DIAGNOSIS — N898 Other specified noninflammatory disorders of vagina: Secondary | ICD-10-CM | POA: Diagnosis not present

## 2018-09-05 ENCOUNTER — Encounter

## 2018-09-08 ENCOUNTER — Other Ambulatory Visit: Payer: Self-pay

## 2018-09-08 ENCOUNTER — Other Ambulatory Visit (INDEPENDENT_AMBULATORY_CARE_PROVIDER_SITE_OTHER): Payer: Medicare HMO

## 2018-09-08 ENCOUNTER — Other Ambulatory Visit: Payer: Medicare HMO

## 2018-09-08 DIAGNOSIS — E039 Hypothyroidism, unspecified: Secondary | ICD-10-CM

## 2018-09-08 LAB — TSH: TSH: 2 u[IU]/mL (ref 0.35–4.50)

## 2018-09-08 LAB — T4, FREE: Free T4: 1.42 ng/dL (ref 0.60–1.60)

## 2018-09-17 ENCOUNTER — Telehealth: Payer: Self-pay | Admitting: Internal Medicine

## 2018-09-17 ENCOUNTER — Other Ambulatory Visit: Payer: Self-pay

## 2018-09-17 DIAGNOSIS — E039 Hypothyroidism, unspecified: Secondary | ICD-10-CM

## 2018-09-17 MED ORDER — LEVOTHYROXINE SODIUM 112 MCG PO TABS: 112.0000 ug | ORAL_TABLET | Freq: Every day | ORAL | 3 refills | Status: AC

## 2018-09-17 NOTE — Telephone Encounter (Signed)
MEDICATION: levothyroxine (SYNTHROID) 112 MCG tablet  PHARMACY:  [facility]  IS THIS A 90 DAY SUPPLY :   IS PATIENT OUT OF MEDICATION:   IF NOT; HOW MUCH IS LEFT:   LAST APPOINTMENT DATE: @7 /11/2018  NEXT APPOINTMENT DATE:@Visit  date not found  DO WE HAVE YOUR PERMISSION TO LEAVE A DETAILED MESSAGE:  OTHER COMMENTS:    **Let patient know to contact pharmacy at the end of the day to make sure medication is ready. **  ** Please notify patient to allow 48-72 hours to process**  **Encourage patient to contact the pharmacy for refills or they can request refills through [facility]**

## 2018-09-17 NOTE — Telephone Encounter (Signed)
levothyroxine (SYNTHROID) 112 MCG tablet 90 tablet 3 09/17/2018    Sig - Route: Take 1 tablet (112 mcg total) by mouth daily before breakfast. - Oral   Sent to pharmacy as: levothyroxine (SYNTHROID) 112 MCG tablet   E-Prescribing Status: Receipt confirmed by pharmacy (09/17/2018 3:09 PM EDT)

## 2018-10-02 ENCOUNTER — Ambulatory Visit: Payer: Medicare HMO | Admitting: Internal Medicine

## 2018-12-25 ENCOUNTER — Ambulatory Visit (INDEPENDENT_AMBULATORY_CARE_PROVIDER_SITE_OTHER): Payer: Medicare HMO | Admitting: Internal Medicine

## 2019-05-06 ENCOUNTER — Telehealth: Payer: Self-pay | Admitting: Internal Medicine

## 2019-05-06 NOTE — Telephone Encounter (Signed)
Noted  

## 2019-05-06 NOTE — Telephone Encounter (Signed)
FYI, Patient called re: Patient moved to [facility] and will no longer be getting treated by Dr. Cruzita Lederer

## 2019-08-13 ENCOUNTER — Ambulatory Visit: Payer: Medicare HMO | Admitting: Internal Medicine
